# Patient Record
Sex: Female | Born: 1990 | Race: White | Hispanic: No | Marital: Single | Smoking: Never smoker
Health system: Southern US, Community
[De-identification: ages and names within clinical notes are randomized; demographics above are authoritative.]

## PROBLEM LIST (undated history)

## (undated) HISTORY — PX: ADENOIDECTOMY: SUR15

---

## 2013-11-02 ENCOUNTER — Inpatient Hospital Stay (HOSPITAL_COMMUNITY): Admission: AD | Admit: 2013-11-02 | Payer: Self-pay | Source: Ambulatory Visit | Admitting: Obstetrics and Gynecology

## 2019-05-10 ENCOUNTER — Other Ambulatory Visit: Payer: Self-pay

## 2019-05-10 ENCOUNTER — Encounter (HOSPITAL_COMMUNITY): Payer: Self-pay | Admitting: Obstetrics & Gynecology

## 2019-05-10 ENCOUNTER — Inpatient Hospital Stay (HOSPITAL_COMMUNITY): Payer: PRIVATE HEALTH INSURANCE

## 2019-05-10 ENCOUNTER — Observation Stay (HOSPITAL_COMMUNITY)
Admission: AD | Admit: 2019-05-10 | Discharge: 2019-05-11 | Disposition: A | Discharge status: Home / Self Care | Payer: PRIVATE HEALTH INSURANCE | Attending: Obstetrics and Gynecology | Admitting: Obstetrics and Gynecology

## 2019-05-10 DIAGNOSIS — D62 Acute posthemorrhagic anemia: Secondary | ICD-10-CM

## 2019-05-10 DIAGNOSIS — O039 Complete or unspecified spontaneous abortion without complication: Secondary | ICD-10-CM | POA: Diagnosis not present

## 2019-05-10 DIAGNOSIS — Z20822 Contact with and (suspected) exposure to covid-19: Secondary | ICD-10-CM | POA: Insufficient documentation

## 2019-05-10 DIAGNOSIS — O4690 Antepartum hemorrhage, unspecified, unspecified trimester: Secondary | ICD-10-CM | POA: Diagnosis not present

## 2019-05-10 DIAGNOSIS — D649 Anemia, unspecified: Secondary | ICD-10-CM | POA: Diagnosis present

## 2019-05-10 LAB — CBC
HCT: 41.3 % (ref 36.0–46.0)
HCT: 36.7 % (ref 36.0–46.0)
Hemoglobin: 14 g/dL (ref 12.0–15.0)
Hemoglobin: 12.4 g/dL (ref 12.0–15.0)
MCH: 32.4 pg (ref 26.0–34.0)
MCH: 32.5 pg (ref 26.0–34.0)
MCHC: 33.9 g/dL (ref 30.0–36.0)
MCHC: 33.8 g/dL (ref 30.0–36.0)
MCV: 95.6 fL (ref 80.0–100.0)
MCV: 96.1 fL (ref 80.0–100.0)
Platelets: 150 10*3/uL (ref 150–400)
Platelets: 225 10*3/uL (ref 150–400)
RBC: 4.32 MIL/uL (ref 3.87–5.11)
RBC: 3.82 MIL/uL — ABNORMAL LOW (ref 3.87–5.11)
RDW: 11.9 % (ref 11.5–15.5)
RDW: 12.1 % (ref 11.5–15.5)
WBC: 12.8 10*3/uL — ABNORMAL HIGH (ref 4.0–10.5)
WBC: 23.4 10*3/uL — ABNORMAL HIGH (ref 4.0–10.5)
nRBC: 0 % (ref 0.0–0.2)
nRBC: 0 % (ref 0.0–0.2)

## 2019-05-10 LAB — ABO/RH: ABO/RH(D): A NEG

## 2019-05-10 LAB — HCG, QUANTITATIVE, PREGNANCY: hCG, Beta Chain, Quant, S: 16681 m[IU]/mL — ABNORMAL HIGH (ref <5)

## 2019-05-10 MED ORDER — LACTATED RINGERS IV BOLUS
1000.0000 mL | Freq: Once | INTRAVENOUS | Status: AC
  Administered 2019-05-10: 1000 mL via INTRAVENOUS

## 2019-05-10 MED ORDER — RHO D IMMUNE GLOBULIN 1500 UNIT/2ML IJ SOSY
300.0000 ug | PREFILLED_SYRINGE | Freq: Once | INTRAMUSCULAR | Status: AC
  Administered 2019-05-10: 300 ug via INTRAMUSCULAR
  Filled 2019-05-10: qty 2

## 2019-05-10 MED ORDER — MISOPROSTOL 200 MCG PO TABS
800.0000 ug | ORAL_TABLET | Freq: Once | ORAL | Status: AC
  Administered 2019-05-10: 800 ug via BUCCAL
  Filled 2019-05-10: qty 4

## 2019-05-10 NOTE — MAU Note (Signed)
Pt states she has been bleeding for the past few days, was thinking she was miscarrying and started cramping around 1630 rating it a 7/10. Around 1745 started feeling a lot of pressure, got in shower and started "losing a lot of blood". Passed one large clot and has had a lot of bleeding since.

## 2019-05-10 NOTE — Progress Notes (Signed)
Pt was evaluated and treated by another provider and discharged. Prior to d/c pt had episode of syncope when she got up to BR. Pt found to be pale with HR in 50s. Pt A&O with verbal stimulus. Pt assisted to bed and BP 77/36. Stat IVF and labs ordered. Bleeding minimal.  2351: Small trickle from vagina. Pt feeling better. VSS. Hgb stable at 12.4 (started at 14.0) Speculum exam: large amt of blood and clots removed. POC almost completely through os, removed with ring forceps. Bleeding slowed. Cytotec ordered.  0215: Bleeding minimal. Ambulated to BR with assist and became lightheaded and nauseated on the way back. BP 80/41. No syncope. Discussed with Dr. Debroah Loop, will recheck CBC and admit for Obs.

## 2019-05-10 NOTE — Progress Notes (Signed)
Finished reviewing discharge instructions and obtaining signature. Pt stated she needed to try to go to the restroom before she left. Patient ambulated to the bathroom and sat on the toilet. Stated she felt like she was going to pass out. She then passed out while on the toilet, RN supported the patient and assisted her to the floor safely. Emergency cord was pulled in order to obtain more help, an additional RN came and provider was called in to assess the patient. Patient appeared pale and diaphoretic. She then spontaneously regained consciousness and was able to verbalize where she was and responded to commands while in the lying position. Two RNs and provider assisted the patient to the wheelchair in order to get her back to the bed. Pt stated she was nauseated and began to vomit. Once stable the patient was moved back to the bed where BP cuff and pulse ox were applied and the order was given by the provider to apply oxygen via a nasal cannula and start an IV/draw a few more labs. Patient verbalized feeling a little tingly but better lying down in bed. Pt color started to return in her cheeks and lips. Pt smiling and carrying out conversation.

## 2019-05-10 NOTE — MAU Provider Note (Addendum)
History     CSN: [id number]  Arrival date and time: 05/10/19 8101   First Provider Initiated Contact with Patient 05/10/19 2016       Chief Complaint  Patient presents with  . Vaginal Bleeding   Vickie Estes is a 29 y.o. G3P2 at [redacted]w[redacted]d by LMP who presents to MAU with complaints of possible miscarriage. Patient reports vaginal spotting started on Wednesday, described as light pink when she wiped. Bleeding continued to increase over the weekend then significantly increased today. Patient reports dark red vaginal bleeding that soaked through underwear and pants, patient reports getting lightheaded and dizzy upon arrival to MAU. Patient reports abdominal pain which she describes as lower abdominal cramping that started occurring once the vaginal bleeding increased. Rates pain 6/10- declines pain medication at this time. Patient reports having a ultrasound 3 weeks ago at [facility] and patient reports an IUP but unable to hear heart tones at that time.    OB History    Gravida  1   Para      Term      Preterm      AB      Living        SAB      TAB      Ectopic      Multiple      Live Births              No past medical history on file.  Past Surgical History:  Procedure Laterality Date  . ADENOIDECTOMY      No family history on file.  Social History   Tobacco Use  . Smoking status: Not on file  Substance Use Topics  . Alcohol use: Not on file  . Drug use: Not on file    Allergies: Not on File  No medications prior to admission.    Review of Systems  Constitutional: Negative.   Respiratory: Negative.   Cardiovascular: Negative.   Gastrointestinal: Positive for abdominal pain. Negative for constipation, diarrhea, nausea and vomiting.  Genitourinary: Positive for vaginal bleeding. Negative for difficulty urinating, dysuria, frequency, pelvic pain and urgency.  Neurological: Positive for dizziness and light-headedness.   Physical Exam    Blood pressure (!) 111/59, pulse 73, temperature 98 F (36.7 C), temperature source Oral, resp. rate 16, SpO2 100 %.  Physical Exam  Nursing note and vitals reviewed. Constitutional: She is oriented to person, place, and time. She appears well-developed and well-nourished. She appears distressed.  Patient tearful in room, sitting on toilet upon arrival to room  Cardiovascular: Normal rate and regular rhythm.  Respiratory: Effort normal and breath sounds normal.  GI: Soft. She exhibits no distension. There is abdominal tenderness. There is no rebound and no guarding.  Genitourinary:    Vaginal bleeding present.  There is bleeding in the vagina.    Genitourinary Comments: Pelvic exam: Cervix pink, visually open with products of contraception protruding from cervix, without lesion, copious amounts of dark red vaginal bleeding with large clots passed (11 faux swabs), vaginal walls and external genitalia normal   Musculoskeletal:        General: No edema. Normal range of motion.  Neurological: She is alert and oriented to person, place, and time.  Psychiatric: She has a normal mood and affect. Her behavior is normal. Thought content normal.   MAU Course  Procedures  MDM Orders Placed This Encounter  Procedures  . US OB LESS THAN 14 WEEKS WITH OB TRANSVAGINAL  . Urinalysis, Routine  w reflex microscopic  . CBC  . hCG, quantitative, pregnancy  . ABO/Rh   Patient denies lightheadedness and dizzy once patient moved off toilet to bed.  Workup for SAB initiated  Patient does not want POC manual removed, wants to occur naturally   Hgb 14  ABO/Rh negative - Rhogam given to patient prior to discharge Labs and [place] report reviewed:  US OB Comp Less 14 Wks  Result Date: 05/10/2019 CLINICAL DATA:  Heavy vaginal bleeding EXAM: OBSTETRIC <14 WK ULTRASOUND TECHNIQUE: Transabdominal ultrasound was performed for evaluation of the gestation as well as the maternal uterus and adnexal regions.  COMPARISON:  None. FINDINGS: Intrauterine gestational sac: None Maternal uterus/adnexae: Endometrium is thickened and slightly heterogeneous measuring 16 mm. The uterus is anteverted. No uterine masses. Heterogeneous material within the cervical canal consistent with blood products. Left ovary measures 3.0 x 1.5 x 2.1 cm in the right ovary measures 2.8 x 1.8 x 1.7 cm. No pelvic free fluid. IMPRESSION: 1. No evidence of intrauterine pregnancy at this time. Heterogeneous endometrial thickening and likely blood products within the cervix may suggest spontaneous abortion. Serial beta HCG measurements and follow-up ultrasound may be needed. 2. Otherwise unremarkable exam. Electronically Signed   By: Sharlet Salina M.D.   On: 05/10/2019 21:48   Discussed with patient ultrasound results. Patient is tearful but coping appropriately.   Discussed with patient options of expectant management or cytotec to rid of rest of POCs. Patient decided on expectant management, does not want cytotec or pain medication at this time unless it is needed.   Patient denies being light headed or dizzy. Patient color is normal and not pale. Vaginal bleeding assess and minimal on pad. Patient reports abdominal pain is decreasing.   Educated and discussed follow up care in the office. Lab scheduled for this week d/t patient needing Friday and follow up with provider scheduled for 2 weeks. Educated and discussed with patient reasons to present to MAU for evaluation, discussed any occurrence of increased vaginal bleeding/passing clots, increased abdominal pain and/or lightheaded, dizziness present to MAU. Patient verbalizes understanding and is currently stable at time of discharge.   Assessment and Plan   1. SAB (spontaneous abortion)   2. Vaginal bleeding during pregnancy    Discharge home Follow up as scheduled in the office  Return to MAU as needed for reasons discussed and/or emergencies   Allergies as of 05/11/2019   No  Known Allergies     Medication List    TAKE these medications   prenatal multivitamin Tabs tablet Take 1 tablet by mouth daily at 12 noon.       Sharyon Cable CNM 05/10/2019, 10:10 PM

## 2019-05-11 ENCOUNTER — Encounter (HOSPITAL_COMMUNITY): Payer: Self-pay | Admitting: Obstetrics & Gynecology

## 2019-05-11 DIAGNOSIS — D649 Anemia, unspecified: Secondary | ICD-10-CM | POA: Diagnosis present

## 2019-05-11 LAB — CBC WITH DIFFERENTIAL/PLATELET
Abs Immature Granulocytes: 0.03 10*3/uL (ref 0.00–0.07)
Basophils Absolute: 0 10*3/uL (ref 0.0–0.1)
Basophils Relative: 0 %
Eosinophils Absolute: 0 10*3/uL (ref 0.0–0.5)
Eosinophils Relative: 0 %
HCT: 32.3 % — ABNORMAL LOW (ref 36.0–46.0)
Hemoglobin: 11.1 g/dL — ABNORMAL LOW (ref 12.0–15.0)
Immature Granulocytes: 0 %
Lymphocytes Relative: 20 %
Lymphs Abs: 1.5 10*3/uL (ref 0.7–4.0)
MCH: 32 pg (ref 26.0–34.0)
MCHC: 34.4 g/dL (ref 30.0–36.0)
MCV: 93.1 fL (ref 80.0–100.0)
Monocytes Absolute: 0.5 10*3/uL (ref 0.1–1.0)
Monocytes Relative: 7 %
Neutro Abs: 5.3 10*3/uL (ref 1.7–7.7)
Neutrophils Relative %: 73 %
Platelets: 121 10*3/uL — ABNORMAL LOW (ref 150–400)
RBC: 3.47 MIL/uL — ABNORMAL LOW (ref 3.87–5.11)
RDW: 13.3 % (ref 11.5–15.5)
WBC: 7.4 10*3/uL (ref 4.0–10.5)
nRBC: 0 % (ref 0.0–0.2)

## 2019-05-11 LAB — CBC
HCT: 29.6 % — ABNORMAL LOW (ref 36.0–46.0)
HCT: 25.6 % — ABNORMAL LOW (ref 36.0–46.0)
Hemoglobin: 10 g/dL — ABNORMAL LOW (ref 12.0–15.0)
Hemoglobin: 8.6 g/dL — ABNORMAL LOW (ref 12.0–15.0)
MCH: 32.5 pg (ref 26.0–34.0)
MCH: 32.5 pg (ref 26.0–34.0)
MCHC: 33.8 g/dL (ref 30.0–36.0)
MCHC: 33.6 g/dL (ref 30.0–36.0)
MCV: 96.1 fL (ref 80.0–100.0)
MCV: 96.6 fL (ref 80.0–100.0)
Platelets: 139 10*3/uL — ABNORMAL LOW (ref 150–400)
Platelets: 123 10*3/uL — ABNORMAL LOW (ref 150–400)
RBC: 3.08 MIL/uL — ABNORMAL LOW (ref 3.87–5.11)
RBC: 2.65 MIL/uL — ABNORMAL LOW (ref 3.87–5.11)
RDW: 12.1 % (ref 11.5–15.5)
RDW: 12.2 % (ref 11.5–15.5)
WBC: 11.2 10*3/uL — ABNORMAL HIGH (ref 4.0–10.5)
WBC: 7.3 10*3/uL (ref 4.0–10.5)
nRBC: 0 % (ref 0.0–0.2)
nRBC: 0 % (ref 0.0–0.2)

## 2019-05-11 LAB — RH IG WORKUP (INCLUDES ABO/RH)
ABO/RH(D): A NEG
Antibody Screen: NEGATIVE
Gestational Age(Wks): 11
Unit division: 0

## 2019-05-11 LAB — PREPARE RBC (CROSSMATCH)

## 2019-05-11 LAB — SARS CORONAVIRUS 2 (TAT 6-24 HRS): SARS Coronavirus 2: NEGATIVE

## 2019-05-11 MED ORDER — AMMONIA AROMATIC IN INHA
RESPIRATORY_TRACT | Status: AC
  Administered 2019-05-11: 3 mL
  Filled 2019-05-11: qty 10

## 2019-05-11 MED ORDER — ACETAMINOPHEN 325 MG PO TABS
650.0000 mg | ORAL_TABLET | ORAL | Status: DC | PRN
  Administered 2019-05-11: 650 mg via ORAL
  Filled 2019-05-11: qty 2

## 2019-05-11 MED ORDER — LACTATED RINGERS IV BOLUS
1000.0000 mL | Freq: Once | INTRAVENOUS | Status: AC
  Administered 2019-05-11: 1000 mL via INTRAVENOUS

## 2019-05-11 MED ORDER — LACTATED RINGERS IV SOLN: INTRAVENOUS | Status: DC

## 2019-05-11 MED ORDER — ACETAMINOPHEN 325 MG PO TABS
650.0000 mg | ORAL_TABLET | Freq: Once | ORAL | Status: AC
  Administered 2019-05-11: 650 mg via ORAL
  Filled 2019-05-11: qty 2

## 2019-05-11 MED ORDER — SODIUM CHLORIDE 0.9% IV SOLUTION: Freq: Once | INTRAVENOUS | Status: DC

## 2019-05-11 MED ORDER — FERROUS SULFATE 325 (65 FE) MG PO TABS
325.0000 mg | ORAL_TABLET | Freq: Three times a day (TID) | ORAL | Status: DC
  Administered 2019-05-11: 325 mg via ORAL
  Filled 2019-05-11 (×2): qty 1

## 2019-05-11 MED ORDER — AMMONIA AROMATIC IN INHA
RESPIRATORY_TRACT | Status: AC
  Filled 2019-05-11: qty 50

## 2019-05-11 MED ORDER — DIPHENHYDRAMINE HCL 50 MG/ML IJ SOLN
25.0000 mg | Freq: Once | INTRAMUSCULAR | Status: AC
  Administered 2019-05-11: 25 mg via INTRAVENOUS
  Filled 2019-05-11: qty 1

## 2019-05-11 MED ORDER — FERROUS SULFATE 325 (65 FE) MG PO TABS: 325.0000 mg | ORAL_TABLET | Freq: Two times a day (BID) | ORAL | 3 refills | Status: AC

## 2019-05-11 NOTE — Discharge Instructions (Signed)
Managing Pregnancy Loss Pregnancy loss can happen any time during a pregnancy. Often the cause is not known. It is rarely because of anything you did. Pregnancy loss in early pregnancy (during the first trimester) is called a miscarriage. This type of pregnancy loss is the most common. Pregnancy loss that happens after 20 weeks of pregnancy is called fetal demise if the baby's heart stops beating before birth. Fetal demise is much less common. Some women experience spontaneous labor shortly after fetal demise resulting in a stillborn birth (stillbirth). Any pregnancy loss can be devastating. You will need to recover both physically and emotionally. Most women are able to get pregnant again after a pregnancy loss and deliver a healthy baby. How to manage emotional recovery  Pregnancy loss is very hard emotionally. You may feel many different emotions while you grieve. You may feel sad and angry. You may also feel guilty. It is normal to have periods of crying. Emotional recovery can take longer than physical recovery. It is different for everyone. Taking these steps can help you in managing this loss:  Remember that it is unlikely you did anything to cause the pregnancy loss.  Share your thoughts and feelings with friends, family, and your partner. Remember that your partner is also recovering emotionally.  Make sure you have a good support system. Do not spend too much time alone.  Meet with a pregnancy loss counselor or join a pregnancy loss support group.  Get enough sleep and eat a healthy diet. Return to regular exercise when you have recovered physically.  Do not use drugs or alcohol to manage your emotions.  Consider seeing a mental health professional to help you recover emotionally.  Ask a friend or loved one to help you decide what to do with any clothing and nursery items you received for your baby. In the case of a stillbirth, many women benefit from taking additional steps in the  grieving process. You may want to:  Hold your baby after the birth.  Name your baby.  Request a birth certificate.  Create a keepsake such as handprints or footprints.  Dress your baby and have a picture taken.  Make funeral arrangements.  Ask for a baptism or blessing. Hospitals have staff members who can help you with all these arrangements. How to recognize emotional stress It is normal to have emotional stress after a pregnancy loss. But emotional stress that lasts a long time or becomes severe requires treatment. Watch out for these signs of severe emotional stress:  Sadness, anger, or guilt that is not going away and is interfering with your normal activities.  Relationship problems that have occurred or gotten worse since the pregnancy loss.  Signs of depression that last longer than 2 weeks. These may include: ? Sadness. ? Anxiety. ? Hopelessness. ? Loss of interest in activities you enjoy. ? Inability to concentrate. ? Trouble sleeping or sleeping too much. ? Loss of appetite or overeating. ? Thoughts of death or of hurting yourself. Follow these instructions at home:  Take over-the-counter and prescription medicines only as told by your health care provider.  Rest at home until your energy level returns. Return to your normal activities as told by your health care provider. Ask your health care provider what activities are safe for you.  When you are ready, meet with your health care provider to discuss steps to take for a future pregnancy.  Keep all follow-up visits as told by your health care provider. This is important.   Where to find support  To help you and your partner with the process of grieving, talk with your health care provider or seek counseling.  Consider meeting with others who have experienced pregnancy loss. Ask your health care provider about support groups and resources. Where to find more information  [place].S[place] Department of Health and Human  Services Office on Women's Health: www.womenshealth.gov  American Pregnancy Association: www.americanpregnancy.org Contact a health care provider if:  You continue to experience grief, sadness, or lack of motivation for everyday activities, and those feelings do not improve over time.  You are struggling to recover emotionally, especially if you are using alcohol or substances to help. Get help right away if:  You have thoughts of hurting yourself or others. If you ever feel like you may hurt yourself or others, or have thoughts about taking your own life, get help right away. You can go to your nearest emergency department or call:  Your local emergency services (911 in the [place]  A suicide crisis helpline, such as the National Suicide Prevention Lifeline at 1-800-273-8255. This is open 24 hours a day. Summary  Any pregnancy loss can be difficult physically and emotionally.  You may experience many different emotions while you grieve. Emotional recovery can last longer than physical recovery.  It is normal to have emotional stress after a pregnancy loss. But emotional stress that lasts a long time or becomes severe requires treatment.  See your health care provider if you are struggling emotionally after a pregnancy loss. This information is not intended to replace advice given to you by your health care provider. Make sure you discuss any questions you have with your health care provider. Document Revised: 05/27/2018 Document Reviewed: 04/17/2017 Elsevier Patient Education  2020 Elsevier Inc. Miscarriage A miscarriage is the loss of an unborn baby (fetus) before the 20th week of pregnancy. Follow these instructions at home: Medicines   Take over-the-counter and prescription medicines only as told by your doctor.  If you were prescribed antibiotic medicine, take it as told by your doctor. Do not stop taking the antibiotic even if you start to feel better.  Do not take NSAIDs  unless your doctor says that this is safe for you. NSAIDs include aspirin and ibuprofen. These medicines can cause bleeding. Activity  Rest as directed. Ask your doctor what activities are safe for you.  Have someone help you at home during this time. General instructions  Write down how many pads you use each day and how soaked they are.  Watch the amount of tissue or clumps of blood (blood clots) that you pass from your vagina. Save any large amounts of tissue for your doctor.  Do not use tampons, douche, or have sex until your doctor approves.  To help you and your partner with the process of grieving, talk with your doctor or seek counseling.  When you are ready, meet with your doctor to talk about steps you should take for your health. Also, talk with your doctor about steps to take to have a healthy pregnancy in the future.  Keep all follow-up visits as told by your doctor. This is important. Contact a doctor if:  You have a fever or chills.  You have vaginal discharge that smells bad.  You have more bleeding. Get help right away if:  You have very bad cramps or pain in your back or belly.  You pass clumps of blood that are walnut-sized or larger from your vagina.  You pass tissue   that is walnut-sized or larger from your vagina.  You soak more than 1 regular pad in an hour.  You get light-headed or weak.  You faint (pass out).  You have feelings of sadness that do not go away, or you have thoughts of hurting yourself. Summary  A miscarriage is the loss of an unborn baby before the 20th week of pregnancy.  Follow your doctor's instructions for home care. Keep all follow-up appointments.  To help you and your partner with the process of grieving, talk with your doctor or seek counseling. This information is not intended to replace advice given to you by your health care provider. Make sure you discuss any questions you have with your health care provider. Document  Revised: 05/29/2018 Document Reviewed: 03/12/2016 Elsevier Patient Education  2020 Elsevier Inc.  

## 2019-05-11 NOTE — Plan of Care (Signed)
Pt to be discharged home with printed instructions. No concerns noted. Desmond Szabo L Markevius Trombetta, RN  

## 2019-05-11 NOTE — MAU Note (Signed)
Bleeding minimal now. Small amount on pad. Pt sitting up at 90 degrees for 15 min. Stated she felt fine. VSS(see flow sheet). Had pat dangle legs over side of bed for 2-3 min before attempting to go to BR. Pt stated she was ready. Assisted pt to toilet. Voided without comp,ication. passed a moderate sized clot. assisted back to bed. Pt state as she was about to sit in bed she felt a little dizzy and nauseated. Had her lay down in bed and took b/p. B/P dropped to 86/40.  Had pt laied down  And b/p came back up to 95/55.Pt feeling better. Notified M.Bhambri, CNM of incident.  Pt to be admited for observation. STAT CBC ordered.

## 2019-05-11 NOTE — Discharge Summary (Addendum)
Obstetric Discharge Summary Vickie Estes is a 29 y.o. female G3P2002 s/p SAB with symptomatic anemia. Pt seen in MAU with VB and SAB was confirmed. Had syncope episode prior to discharge home. Pt was reexamined and POCs were removed from os, bleeding slowed, and was given Cytotec. Hgb dropped from 14 to 12.4. EBL was 650 mL but bled into toilet and shower at home. Despite IVF pt continued to be symptomatic and hypotensive. She received 2 units pRBC. She ambulated without SOB, chest pain, lightheadedness/dizziness. Patient found stable for discharge Reason for Admission: Vaginal bleeding with SAb, anemai Prenatal Procedures: ultrasound Intrapartum Procedures: none, SAB 11 weeks Postpartum Procedures: none Complications-Operative and Postpartum: none Hemoglobin  Date Value Ref Range Status  05/11/2019 8.6 (L) 12.0 - 15.0 g/dL Final   HCT  Date Value Ref Range Status  05/11/2019 25.6 (L) 36.0 - 46.0 % Final    Physical Exam:  General: alert, cooperative and no distress Lochia: appropriate Uterine Fundus: below pubis DVT Evaluation: No evidence of DVT seen on physical exam.  Discharge Diagnoses: Spontaneous abortion  Discharge Information: Date: 05/11/2019 Activity: pelvic rest Diet: routine Medications: None Condition: stable Instructions: refer to practice specific booklet Discharge to: home Follow-up Information    [facility]. Go on 05/21/2019.   Specialty: Obstetrics and Gynecology Why: Follow up as scheduled in the office with [facility] information: [address] [place] [address] [phone]           Vickie Estes 05/11/2019, 4:06 PM

## 2019-05-11 NOTE — MAU Note (Signed)
M.Bhambri,CNM at bedside for pelvic exam to assess further vag bleeding.  Removed several large clots and POC from vaginal vault.  Bleeding  slowed down. Pt tolerated well. V/ss.(see flow sheet)

## 2019-05-11 NOTE — Progress Notes (Signed)
Called to patient's room. Pt found to be passed out with husband holding her. Smelling salts provided. Patient pale. Fluids open wide. BP 94/45. Dr. Debroah Loop at bedside. Carmelina Dane, RN

## 2019-05-11 NOTE — H&P (Signed)
OBSTETRIC ADMISSION HISTORY AND PHYSICAL  Scotland Dost is a 29 y.o. female G3P2002 s/p SAB with symptomatic anemia. Pt seen in MAU with VB and SAB was confirmed. Had syncope episode prior to discharge home. Pt was reexamined and POCs were removed from os, bleeding slowed, and was given Cytotec. Hgb dropped from 14 to 12.4. EBL was 650 mL but bled into toilet and shower at home. Despite IVF pt continued to be symptomatic and hypotensive.  Prenatal History/Complications: - none  Past Medical History: History reviewed. No pertinent past medical history.  Past Surgical History: Past Surgical History:  Procedure Laterality Date  . ADENOIDECTOMY      Obstetrical History: OB History    Gravida  3   Para  2   Term  2   Preterm      AB      Living  2     SAB      TAB      Ectopic      Multiple      Live Births  2           Social History: Social History   Socioeconomic History  . Marital status: Single    Spouse name: Not on file  . Number of children: Not on file  . Years of education: Not on file  . Highest education level: Not on file  Occupational History  . Not on file  Tobacco Use  . Smoking status: Never Smoker  . Smokeless tobacco: Never Used  Substance and Sexual Activity  . Alcohol use: Not Currently  . Drug use: Never  . Sexual activity: Yes    Birth control/protection: None  Other Topics Concern  . Not on file  Social History Narrative  . Not on file   Social Determinants of Health   Financial Resource Strain:   . Difficulty of Paying Living Expenses:   Food Insecurity:   . Worried About Charity fundraiser in the Last Year:   . Arboriculturist in the Last Year:   Transportation Needs:   . Film/video editor (Medical):   Marland Kitchen Lack of Transportation (Non-Medical):   Physical Activity:   . Days of Exercise per Week:   . Minutes of Exercise per Session:   Stress:   . Feeling of Stress :   Social Connections:   . Frequency of  Communication with Friends and Family:   . Frequency of Social Gatherings with Friends and Family:   . Attends Religious Services:   . Active Member of Clubs or Organizations:   . Attends Archivist Meetings:   Marland Kitchen Marital Status:     Family History: Family History  Problem Relation Age of Onset  . Hypertension Mother     Allergies: No Known Allergies  Medications Prior to Admission  Medication Sig Dispense Refill Last Dose  . Prenatal Vit-Fe Fumarate-FA (PRENATAL MULTIVITAMIN) TABS tablet Take 1 tablet by mouth daily at 12 noon.        Review of Systems:  All systems reviewed and negative except as stated in HPI  PE: Blood pressure (!) 95/55, pulse 72, temperature 98 F (36.7 C), temperature source Oral, resp. rate 17, SpO2 100 %. General appearance: alert, cooperative and no distress Lungs: regular rate and effort Heart: regular rate  Abdomen: soft, non-tender GU: bleeding minimal    Prenatal labs: ABO, Rh: --/--/A NEG (03/22 2240) Antibody: NEG (03/22 2240)  Results for orders placed or performed during the hospital  encounter of 05/10/19 (from the past 24 hour(s))  CBC   Collection Time: 05/10/19  8:37 PM  Result Value Ref Range   WBC 12.8 (H) 4.0 - 10.5 K/uL   RBC 4.32 3.87 - 5.11 MIL/uL   Hemoglobin 14.0 12.0 - 15.0 g/dL   HCT 02.5 85.2 - 77.8 %   MCV 95.6 80.0 - 100.0 fL   MCH 32.4 26.0 - 34.0 pg   MCHC 33.9 30.0 - 36.0 g/dL   RDW 24.2 35.3 - 61.4 %   Platelets 150 150 - 400 K/uL   nRBC 0.0 0.0 - 0.2 %  hCG, quantitative, pregnancy   Collection Time: 05/10/19  8:37 PM  Result Value Ref Range   hCG, Beta Chain, Quant, S 16,681 (H) <5 mIU/mL  ABO/Rh   Collection Time: 05/10/19  8:37 PM  Result Value Ref Range   ABO/RH(D) A NEG    Antibody Screen NOT NEEDED   Rh IG workup (includes ABO/Rh)   Collection Time: 05/10/19  8:37 PM  Result Value Ref Range   Gestational Age(Wks) 11    ABO/RH(D) A NEG    Antibody Screen NEG    Unit Number  [id number]    Blood Component Type RHIG    Unit division 00    Status of Unit ISSUED    Transfusion Status      OK TO TRANSFUSE Performed at [facility], [address] [place], [place] [address]   CBC   Collection Time: 05/10/19 10:37 PM  Result Value Ref Range   WBC 23.4 (H) 4.0 - 10.5 K/uL   RBC 3.82 (L) 3.87 - 5.11 MIL/uL   Hemoglobin 12.4 12.0 - 15.0 g/dL   HCT 67.1 24.5 - 80.9 %   MCV 96.1 80.0 - 100.0 fL   MCH 32.5 26.0 - 34.0 pg   MCHC 33.8 30.0 - 36.0 g/dL   RDW 98.3 38.2 - 50.5 %   Platelets 225 150 - 400 K/uL   nRBC 0.0 0.0 - 0.2 %  Type and screen   Collection Time: 05/10/19 10:40 PM  Result Value Ref Range   ABO/RH(D) A NEG    Antibody Screen NEG    Sample Expiration      05/13/2019,2359 Performed at [facility], [address] [place], [place] [address]     Patient Active Problem List   Diagnosis Date Noted  . Symptomatic anemia 05/11/2019   Assessment: Arleny Kruger is a 29 y.o. G3P2002 s/p SAB with symptomatic anemia   Plan: Admit to [facility] unit T&S CBC now Mngt per Dr. Ripley Fraise, CNM  05/11/2019, 2:31 AM

## 2019-05-12 LAB — TYPE AND SCREEN
ABO/RH(D): A NEG
Antibody Screen: NEGATIVE
Unit division: 0
Unit division: 0

## 2019-05-12 LAB — SURGICAL PATHOLOGY

## 2019-05-12 LAB — BPAM RBC
Blood Product Expiration Date: 202104032359
Blood Product Expiration Date: 202104052359
ISSUE DATE / TIME: 202103230903
ISSUE DATE / TIME: 202103231304
Unit Type and Rh: 600
Unit Type and Rh: 600

## 2019-05-14 ENCOUNTER — Other Ambulatory Visit: Payer: Self-pay

## 2019-05-14 ENCOUNTER — Other Ambulatory Visit: Payer: PRIVATE HEALTH INSURANCE

## 2019-05-14 DIAGNOSIS — O039 Complete or unspecified spontaneous abortion without complication: Secondary | ICD-10-CM

## 2019-05-15 LAB — BETA HCG QUANT (REF LAB): hCG Quant: 1371 m[IU]/mL

## 2019-05-26 ENCOUNTER — Encounter: Payer: Self-pay | Admitting: Certified Nurse Midwife

## 2019-05-26 ENCOUNTER — Other Ambulatory Visit: Payer: Self-pay

## 2019-05-26 ENCOUNTER — Ambulatory Visit (INDEPENDENT_AMBULATORY_CARE_PROVIDER_SITE_OTHER): Payer: PRIVATE HEALTH INSURANCE | Admitting: Certified Nurse Midwife

## 2019-05-26 VITALS — BP 102/66 | HR 67 | Ht 66.0 in | Wt 129.8 lb

## 2019-05-26 DIAGNOSIS — O039 Complete or unspecified spontaneous abortion without complication: Secondary | ICD-10-CM | POA: Diagnosis not present

## 2019-05-26 DIAGNOSIS — D500 Iron deficiency anemia secondary to blood loss (chronic): Secondary | ICD-10-CM

## 2019-05-26 NOTE — Progress Notes (Signed)
Pt is in the office for follow up SAB, pt reports very light spotting and tissue, denies pain today, but has had some random cramping.

## 2019-05-26 NOTE — Progress Notes (Signed)
History:  [name] is a 29 y.o. [id number] who presents to clinic today for SAB follow up   Patient was discharged from [facility] on  3/23 after presenting to [facility]U on 3/22 for vaginal bleeding, she was diagnosed with miscarriage at that time. D/t blood loss and symptomatic anemia patient was kept overnight for blood transfusion.   Patient reports that vaginal bleeding has continued to decrease and is not dark reddish brown mucous discharge. She denies abdominal pain.   The following portions of the patient's history were reviewed and updated as appropriate: allergies, current medications, family history, past medical history, social history, past surgical history and problem list.  Review of Systems:  Review of Systems  Constitutional: Negative.   Respiratory: Negative.   Cardiovascular: Negative.   Gastrointestinal: Negative.   Genitourinary:       Vaginal bleeding/discharge  Musculoskeletal: Negative.       Objective:  Physical Exam BP 102/66   Pulse 67   Ht 5\' 6"  (1.676 m)   Wt 129 lb 12.8 oz (58.9 kg)   Breastfeeding Unknown   BMI 20.95 kg/m  Physical Exam Vitals reviewed.  Cardiovascular:     Rate and Rhythm: Normal rate and regular rhythm.  Pulmonary:     Effort: Pulmonary effort is normal.     Breath sounds: Normal breath sounds.  Abdominal:     General: There is no distension.     Palpations: Abdomen is soft.     Tenderness: There is no abdominal tenderness. There is no guarding.  Skin:    General: Skin is warm and dry.  Neurological:     Mental Status: She is alert and oriented to person, place, and time.  Psychiatric:        Mood and Affect: Mood normal.        Behavior: Behavior normal.        Thought Content: Thought content normal.     Assessment & Plan:  1. Spontaneous miscarriage - Patient doing well, grieving appropriately  - Patient does want to conceive again this year, discussed letting body heal and waiting at least 3 months prior to trying  to conceive  - Discussed with patient that bleeding most likely will stop this week based on description - Beta hCG quant (ref lab)  2. Iron deficiency anemia due to chronic blood loss - follow up CBC obtained following transfusion  - patient denies lightheaded or dizziness since discharge from hospital  - CBC   , CNM 05/26/2019 7:12 PM

## 2019-05-27 LAB — BETA HCG QUANT (REF LAB): hCG Quant: 102 m[IU]/mL

## 2019-05-27 LAB — CBC
Hematocrit: 38.8 % (ref 34.0–46.6)
Hemoglobin: 13.4 g/dL (ref 11.1–15.9)
MCH: 33 pg (ref 26.6–33.0)
MCHC: 34.5 g/dL (ref 31.5–35.7)
MCV: 96 fL (ref 79–97)
Platelets: 188 10*3/uL (ref 150–450)
RBC: 4.06 x10E6/uL (ref 3.77–5.28)
RDW: 12.9 % (ref 11.7–15.4)
WBC: 6.9 10*3/uL (ref 3.4–10.8)

## 2019-05-28 ENCOUNTER — Encounter: Payer: Self-pay | Admitting: Certified Nurse Midwife

## 2019-10-18 ENCOUNTER — Inpatient Hospital Stay (HOSPITAL_COMMUNITY): Payer: PRIVATE HEALTH INSURANCE

## 2019-10-18 ENCOUNTER — Inpatient Hospital Stay (HOSPITAL_COMMUNITY)
Admission: AD | Admit: 2019-10-18 | Discharge: 2019-10-18 | Disposition: A | Discharge status: Home / Self Care | Payer: PRIVATE HEALTH INSURANCE | Attending: Obstetrics & Gynecology | Admitting: Obstetrics & Gynecology

## 2019-10-18 ENCOUNTER — Encounter (HOSPITAL_COMMUNITY): Payer: Self-pay

## 2019-10-18 ENCOUNTER — Other Ambulatory Visit: Payer: Self-pay

## 2019-10-18 DIAGNOSIS — O4690 Antepartum hemorrhage, unspecified, unspecified trimester: Secondary | ICD-10-CM

## 2019-10-18 DIAGNOSIS — Z3A09 9 weeks gestation of pregnancy: Secondary | ICD-10-CM

## 2019-10-18 DIAGNOSIS — O360910 Maternal care for other rhesus isoimmunization, first trimester, not applicable or unspecified: Secondary | ICD-10-CM | POA: Diagnosis not present

## 2019-10-18 DIAGNOSIS — Z6791 Unspecified blood type, Rh negative: Secondary | ICD-10-CM

## 2019-10-18 DIAGNOSIS — O039 Complete or unspecified spontaneous abortion without complication: Secondary | ICD-10-CM | POA: Insufficient documentation

## 2019-10-18 LAB — COMPREHENSIVE METABOLIC PANEL
ALT: 12 U/L (ref 0–44)
AST: 19 U/L (ref 15–41)
Albumin: 4.2 g/dL (ref 3.5–5.0)
Alkaline Phosphatase: 37 U/L — ABNORMAL LOW (ref 38–126)
Anion gap: 11 (ref 5–15)
BUN: 9 mg/dL (ref 6–20)
CO2: 23 mmol/L (ref 22–32)
Calcium: 9.4 mg/dL (ref 8.9–10.3)
Chloride: 102 mmol/L (ref 98–111)
Creatinine, Ser: 0.71 mg/dL (ref 0.44–1.00)
GFR calc Af Amer: >60 mL/min (ref >60)
GFR calc non Af Amer: >60 mL/min (ref >60)
Glucose, Bld: 103 mg/dL — ABNORMAL HIGH (ref 70–99)
Potassium: 3.8 mmol/L (ref 3.5–5.1)
Sodium: 136 mmol/L (ref 135–145)
Total Bilirubin: 0.7 mg/dL (ref 0.3–1.2)
Total Protein: 7 g/dL (ref 6.5–8.1)

## 2019-10-18 LAB — CBC
HCT: 39.3 % (ref 36.0–46.0)
Hemoglobin: 13.2 g/dL (ref 12.0–15.0)
MCH: 31.7 pg (ref 26.0–34.0)
MCHC: 33.6 g/dL (ref 30.0–36.0)
MCV: 94.5 fL (ref 80.0–100.0)
Platelets: 149 10*3/uL — ABNORMAL LOW (ref 150–400)
RBC: 4.16 MIL/uL (ref 3.87–5.11)
RDW: 12.2 % (ref 11.5–15.5)
WBC: 8.1 10*3/uL (ref 4.0–10.5)
nRBC: 0 % (ref 0.0–0.2)

## 2019-10-18 LAB — URINALYSIS, ROUTINE W REFLEX MICROSCOPIC
Bacteria, UA: NONE SEEN
Bilirubin Urine: NEGATIVE
Glucose, UA: NEGATIVE mg/dL
Ketones, ur: NEGATIVE mg/dL
Leukocytes,Ua: NEGATIVE
Nitrite: NEGATIVE
Protein, ur: NEGATIVE mg/dL
RBC / HPF: >50 RBC/hpf — ABNORMAL HIGH (ref 0–5)
Specific Gravity, Urine: 1.018 (ref 1.005–1.030)
pH: 7 (ref 5.0–8.0)

## 2019-10-18 LAB — WET PREP, GENITAL
Clue Cells Wet Prep HPF POC: NONE SEEN
Sperm: NONE SEEN
Trich, Wet Prep: NONE SEEN
WBC, Wet Prep HPF POC: NONE SEEN
Yeast Wet Prep HPF POC: NONE SEEN

## 2019-10-18 LAB — POCT PREGNANCY, URINE: Preg Test, Ur: POSITIVE — AB

## 2019-10-18 LAB — HCG, QUANTITATIVE, PREGNANCY: hCG, Beta Chain, Quant, S: 35862 m[IU]/mL — ABNORMAL HIGH (ref <5)

## 2019-10-18 MED ORDER — ACETAMINOPHEN 500 MG PO TABS
1000.0000 mg | ORAL_TABLET | Freq: Once | ORAL | Status: AC
  Administered 2019-10-18: 1000 mg via ORAL
  Filled 2019-10-18: qty 2

## 2019-10-18 MED ORDER — MISOPROSTOL 200 MCG PO TABS: 200.0000 ug | ORAL_TABLET | Freq: Three times a day (TID) | ORAL | 0 refills | Status: AC

## 2019-10-18 MED ORDER — RHO D IMMUNE GLOBULIN 1500 UNIT/2ML IJ SOSY
300.0000 ug | PREFILLED_SYRINGE | Freq: Once | INTRAMUSCULAR | Status: AC
  Administered 2019-10-18: 300 ug via INTRAMUSCULAR
  Filled 2019-10-18: qty 2

## 2019-10-18 NOTE — MAU Note (Signed)
Vickie Estes is a 29 y.o. at Unknown here in [facility]U reporting: abdominal pain and VB, pt reports having a positive home pregnancy test LMP: 08/12/2019 Onset of complaint: pt states bleeding started a couple of days ago that has progressively gotten worse and has started to have stringy clots. Pain score:3    Lab orders placed from triage: urine preg test

## 2019-10-18 NOTE — Discharge Instructions (Signed)
Miscarriage A miscarriage is the loss of an unborn baby (fetus) before the 20th week of pregnancy. Most miscarriages happen during the first 3 months of pregnancy. Sometimes, a miscarriage can happen before a woman knows that she is pregnant. Having a miscarriage can be an emotional experience. If you have had a miscarriage, talk with your health care provider about any questions you may have about miscarrying, the grieving process, and your plans for future pregnancy. What are the causes? A miscarriage may be caused by:  Problems with the genes or chromosomes of the fetus. These problems make it impossible for the baby to develop normally. They are often the result of random errors that occur early in the development of the baby, and are not passed from parent to child (not inherited).  Infection of the cervix or uterus.  Conditions that affect hormone balance in the body.  Problems with the cervix, such as the cervix opening and thinning before pregnancy is at term (cervical insufficiency).  Problems with the uterus. These may include: ? A uterus with an abnormal shape. ? Fibroids in the uterus. ? Congenital abnormalities. These are problems that were present at birth.  Certain medical conditions.  Smoking, drinking alcohol, or using drugs.  Injury (trauma). In many cases, the cause of a miscarriage is not known. What are the signs or symptoms? Symptoms of this condition include:  Vaginal bleeding or spotting, with or without cramps or pain.  Pain or cramping in the abdomen or lower back.  Passing fluid, tissue, or blood clots from the vagina. How is this diagnosed? This condition may be diagnosed based on:  A physical exam.  Ultrasound.  Blood tests.  Urine tests. How is this treated? Treatment for a miscarriage is sometimes not necessary if you naturally pass all the tissue that was in your uterus. If necessary, this condition may be treated with:  Dilation and  curettage (D&C). This is a procedure in which the cervix is stretched open and the lining of the uterus (endometrium) is scraped. This is done only if tissue from the fetus or placenta remains in the body (incomplete miscarriage).  Medicines, such as: ? Antibiotic medicine, to treat infection. ? Medicine to help the body pass any remaining tissue. ? Medicine to reduce (contract) the size of the uterus. These medicines may be given if you have a lot of bleeding. If you have Rh negative blood and your baby was Rh positive, you will need a shot of a medicine called Rh immunoglobulinto protect your future babies from Rh blood problems. "Rh-negative" and "Rh-positive" refer to whether or not the blood has a specific protein found on the surface of red blood cells (Rh factor). Follow these instructions at home: Medicines   Take over-the-counter and prescription medicines only as told by your health care provider.  If you were prescribed antibiotic medicine, take it as told by your health care provider. Do not stop taking the antibiotic even if you start to feel better.  Do not take NSAIDs, such as aspirin and ibuprofen, unless they are approved by your health care provider. These medicines can cause bleeding. Activity  Rest as directed. Ask your health care provider what activities are safe for you.  Have someone help with home and family responsibilities during this time. General instructions  Keep track of the number of sanitary pads you use each day and how soaked (saturated) they are. Write down this information.  Monitor the amount of tissue or blood clots that   you pass from your vagina. Save any large amounts of tissue for your health care provider to examine.  Do not use tampons, douche, or have sex until your health care provider approves.  To help you and your partner with the process of grieving, talk with your health care provider or seek counseling.  When you are ready, meet with  your health care provider to discuss any important steps you should take for your health. Also, discuss steps you should take to have a healthy pregnancy in the future.  Keep all follow-up visits as told by your health care provider. This is important. Where to find more information  The American Congress of Obstetricians and Gynecologists: www.acog.org  [place] Department of Health and Programmer, systems of Women's Health: [place] Contact a health care provider if:  You have a fever or chills.  You have a foul smelling vaginal discharge.  You have more bleeding instead of less. Get help right away if:  You have severe cramps or pain in your back or abdomen.  You pass blood clots or tissue from your vagina that is walnut-sized or larger.  You soak more than 1 regular sanitary pad in an hour.  You become light-headed or weak.  You pass out.  You have feelings of sadness that take over your thoughts, or you have thoughts of hurting yourself. Summary  Most miscarriages happen in the first 3 months of pregnancy. Sometimes miscarriage happens before a woman even knows that she is pregnant.  Follow your health care provider's instruction for home care. Keep all follow-up appointments.  To help you and your partner with the process of grieving, talk with your health care provider or seek counseling. This information is not intended to replace advice given to you by your health care provider. Make sure you discuss any questions you have with your health care provider. Document Revised: 05/29/2018 Document Reviewed: 03/12/2016 Elsevier Patient Education  [place] Pregnancy Loss Pregnancy loss can happen any time during a pregnancy. Often the cause is not known. It is rarely because of anything you did. Pregnancy loss in early pregnancy (during the first trimester) is called a miscarriage. This type of pregnancy loss is the most common.  Pregnancy loss that happens after 20 weeks of pregnancy is called fetal demise if the baby's heart stops beating before birth. Fetal demise is much less common. Some women experience spontaneous labor shortly after fetal demise resulting in a stillborn birth (stillbirth). Any pregnancy loss can be devastating. You will need to recover both physically and emotionally. Most women are able to get pregnant again after a pregnancy loss and deliver a healthy baby. How to manage emotional recovery  Pregnancy loss is very hard emotionally. You may feel many different emotions while you grieve. You may feel sad and angry. You may also feel guilty. It is normal to have periods of crying. Emotional recovery can take longer than physical recovery. It is different for everyone. Taking these steps can help you in managing this loss:  Remember that it is unlikely you did anything to cause the pregnancy loss.  Share your thoughts and feelings with friends, family, and your partner. Remember that your partner is also recovering emotionally.  Make sure you have a good support system. Do not spend too much time alone.  Meet with a pregnancy loss counselor or join a pregnancy loss support group.  Get enough sleep and eat a  healthy diet. Return to regular exercise when you have recovered physically.  Do not use drugs or alcohol to manage your emotions.  Consider seeing a mental health professional to help you recover emotionally.  Ask a friend or loved one to help you decide what to do with any clothing and nursery items you received for your baby. In the case of a stillbirth, many women benefit from taking additional steps in the grieving process. You may want to:  Hold your baby after the birth.  Name your baby.  Request a birth certificate.  Create a keepsake such as handprints or footprints.  Dress your baby and have a picture taken.  Make funeral arrangements.  Ask for a baptism or  blessing. Hospitals have staff members who can help you with all these arrangements. How to recognize emotional stress It is normal to have emotional stress after a pregnancy loss. But emotional stress that lasts a long time or becomes severe requires treatment. Watch out for these signs of severe emotional stress:  Sadness, anger, or guilt that is not going away and is interfering with your normal activities.  Relationship problems that have occurred or gotten worse since the pregnancy loss.  Signs of depression that last longer than 2 weeks. These may include: ? Sadness. ? Anxiety. ? Hopelessness. ? Loss of interest in activities you enjoy. ? Inability to concentrate. ? Trouble sleeping or sleeping too much. ? Loss of appetite or overeating. ? Thoughts of death or of hurting yourself. Follow these instructions at home:  Take over-the-counter and prescription medicines only as told by your health care provider.  Rest at home until your energy level returns. Return to your normal activities as told by your health care provider. Ask your health care provider what activities are safe for you.  When you are ready, meet with your health care provider to discuss steps to take for a future pregnancy.  Keep all follow-up visits as told by your health care provider. This is important. Where to find support  To help you and your partner with the process of grieving, talk with your health care provider or seek counseling.  Consider meeting with others who have experienced pregnancy loss. Ask your health care provider about support groups and resources. Where to find more information  U[place]S. Department of Health and Programmer, systems on Women's Health: [place]  American Pregnancy Association: www.americanpregnancy.org Contact a health care provider if:  You continue to experience grief, sadness, or lack of motivation for everyday activities, and those feelings do not  improve over time.  You are struggling to recover emotionally, especially if you are using alcohol or substances to help. Get help right away if:  You have thoughts of hurting yourself or others. If you ever feel like you may hurt yourself or others, or have thoughts about taking your own life, get help right away. You can go to your nearest emergency department or call:  Your local emergency services (911 in the [place]  A suicide crisis helpline, such as the [place] at (817)514-8234. This is open 24 hours a day. Summary  Any pregnancy loss can be difficult physically and emotionally.  You may experience many different emotions while you grieve. Emotional recovery can last longer than physical recovery.  It is normal to have emotional stress after a pregnancy loss. But emotional stress that lasts a long time or becomes severe requires treatment.  See your health care provider if you are struggling emotionally  after a pregnancy loss. This information is not intended to replace advice given to you by your health care provider. Make sure you discuss any questions you have with your health care provider. Document Revised: 05/27/2018 Document Reviewed: 04/17/2017 Elsevier Patient Education  2020 Elsevier Inc.        Recurrent Pregnancy Loss Recurrent pregnancy loss is the loss of two or more pregnancies before 20 weeks of pregnancy (gestation). What are the causes? The most common cause of recurrent pregnancy loss is an abnormal number of chromosomes in the developing baby (fetus). Chromosomes are the structures inside a cell that hold all the genetic material. Chromosome abnormalities can be inherited, but most of them occur by chance. In most cases of recurrent pregnancy loss, a missing or extra chromosome keeps the baby from developing. It may not be possible to identify which chromosome is defective. Other possible causes of recurrent pregnancy loss  include:  Being born with an abnormal womb structure (septate uterus).  Having noncancerous growths in your uterus (fibroids or polyps).  Having a disease that causes scarring in your uterus (Asherman syndrome).  Having a disease that causes your blood to clot (antiphospholipid syndrome).  Having a disease that increases bleeding (thrombophilia). What increases the risk? The following factors may make you more likely to develop this condition:  Being over the age of 8.  Having diabetes.  Having thyroid disease.  Being Obese.  Being a smoker.  Using recreational drugs.  Using too much alcohol or caffeine. What are the signs or symptoms? Symptoms of this condition include:  Bleeding from the vagina.  Passing clots and fetal tissue from the vagina. How is this diagnosed? This condition is diagnosed with:  A physical exam. This will include a pelvic exam to check the vagina and uterus for possible causes of pregnancy loss.  An ultrasound. This is done: ? To confirm the pregnancy loss. ? To see if the structure of the uterus is normal. ? To check for polyps or fibroids. Sometimes other tests are done, such as:  Blood tests to see if you have a condition that causes recurrent pregnancy loss.  Blood tests to see if your blood clots normally.  Genetic tests of you and your partner. How is this treated? Treatment for this condition depends on the cause of the pregnancy loss. Possible treatments include:  Having your eggs fertilized outside your uterus (in vitro fertilization). By doing this, a health care provider may be able to select eggs without chromosome abnormalities.  Taking a blood thinner to prevent clotting. This may be done if you have antiphospholipid syndrome.  Having surgery to correct the abnormality in your uterus.  Taking medicines to treat underlying causes of pregnancy loss. Follow these instructions at home: Lifestyle  Do not use any products  that contain nicotine or tobacco, such as cigarettes and e-cigarettes. If you need help quitting, ask your health care provider.  Do not use drugs.  Eat a balanced diet rich in fresh fruit and vegetables, whole grains, low-fat dairy, and lean meats.  Manage any chronic health conditions, such as diabetes or thyroid disease.  Get 30 minutes of moderate daily exercise.  If you are overweight, ask your health care provider for support and resources to lose weight.  Find ways to manage stress. These include journaling, yoga, or deep breathing. They also include spending time in nature and practicing meditation. General instructions  Take over-the-counter and prescription medicines only as told by your health care provider.  Get support from friends and loved ones. Unexpected pregnancy loss can be a sad and stressful event.  Consider meeting with a counselor, spiritual leader, or a pregnancy loss support group.  Ask your health care provider about taking a folic acid supplement.  Meet with a Dentist as part of genetic testing to understand the results of any testing.  Keep all follow-up visits as told by your health care provider. This is important. Contact a health care provider if:  You have been trying to get pregnant without success.  You are struggling with sadness or depression. Get help right away if:  You have feelings of sadness that take over your thoughts.  You have thoughts of hurting yourself. Summary  The most common cause of recurrent pregnancy loss is an abnormal number of chromosomes in the developing baby (fetus).  Most of the time, recurrent pregnancy loss happens by chance.  Talk to your health care provider if you are trying to get pregnant and have a history of recurrent pregnancy loss. This information is not intended to replace advice given to you by your health care provider. Make sure you discuss any questions you have with your health care  provider. Document Revised: 05/29/2018 Document Reviewed: 05/01/2016 Elsevier Patient Education  2020 [employer].

## 2019-10-18 NOTE — MAU Provider Note (Signed)
History     CSN: [id number]  Arrival date and time: 10/18/19 0144   First Provider Initiated Contact with Patient 10/18/19 [phone]      Chief Complaint  Patient presents with  . Abdominal Pain  . Vaginal Bleeding   [name] is a 29 y.o. [phone] at [redacted]w[redacted]d who presents to MAU for vaginal bleeding which began 3 days ago. Patient reports bleeding started out "really light" and then today it got progressively worse with clotting and "stringy dark blood." Patient reports clots were about the size of a dime. Patient reports she did pass a circular clot about half the size of her palm while in MAU, but flushed it prior to being viewed by staff. Patient also endorses period-like cramping, which is only slightly present at this time. Patient reports most of her discomfort tonight was at the bottom of her uterus and "at her cervix" but reports that was earlier tonight and is not really present at this time. Patient reports today was her heaviest day of bleeding and she used 2 pads today  Passing blood clots? Per above Blood soaking clothes? no Lightheaded/dizzy? no Significant pelvic pain or cramping? Per above Passed any tissue? no  Current pregnancy problems? Pt has not yet been seen Blood Type? A NEGATIVE Allergies? NKDA Current medications? PNVs, iron Current P[facility] & next appt? [facility]  Pt denies vaginal discharge/odor/itching. Pt denies N/V, abdominal pain, constipation, diarrhea, or urinary problems. Pt denies fever, chills, fatigue, sweating or changes in appetite. Pt denies SOB or chest pain. Pt denies dizziness, HA, light-headedness, weakness.   OB History    Gravida  4   Para  2   Term  2   Preterm      AB  1   Living  2     SAB  1   TAB      Ectopic      Multiple      Live Births  2           History reviewed. No pertinent past medical history.  Past Surgical History:  Procedure Laterality Date  . ADENOIDECTOMY      Family  History  Problem Relation Age of Onset  . Hypertension Mother     Social History   Tobacco Use  . Smoking status: Never Smoker  . Smokeless tobacco: Never Used  Substance Use Topics  . Alcohol use: Not Currently  . Drug use: Never    Allergies: No Known Allergies  Medications Prior to Admission  Medication Sig Dispense Refill Last Dose  . ferrous sulfate 325 (65 FE) MG tablet Take 1 tablet (325 mg total) by mouth 2 (two) times daily with a meal. 60 tablet 3 10/17/2019 at Unknown time  . Prenatal Vit-Fe Fumarate-FA (PRENATAL MULTIVITAMIN) TABS tablet Take 1 tablet by mouth daily at 12 noon.   10/17/2019 at Unknown time  . acetaminophen (TYLENOL) 500 MG tablet Take 500 mg by mouth every 6 (six) hours as needed for mild pain or headache.       Review of Systems  Constitutional: Negative for chills, diaphoresis, fatigue and fever.  Eyes: Negative for visual disturbance.  Respiratory: Negative for shortness of breath.   Cardiovascular: Negative for chest pain.  Gastrointestinal: Negative for abdominal pain, constipation, diarrhea, nausea and vomiting.  Genitourinary: Positive for pelvic pain and vaginal bleeding. Negative for dysuria, flank pain, frequency, urgency and vaginal discharge.  Neurological: Negative for dizziness, weakness, light-headedness and headaches.   Physical Exam  Blood pressure 111/71, pulse 73, temperature 97.9 F (36.6 C), temperature source Oral, resp. rate 18, weight 59.8 kg, last menstrual period 08/12/2019, SpO2 100 %, unknown if currently breastfeeding.  Patient Vitals for the past 24 hrs:  BP Temp Temp src Pulse Resp SpO2 Weight  10/18/19 0201 111/71 97.9 F (36.6 C) Oral 73 18 100 % 59.8 kg   Physical Exam Vitals and nursing note reviewed. Exam conducted with a chaperone present.  Constitutional:      General: She is not in acute distress.    Appearance: Normal appearance. She is normal weight. She is not ill-appearing, toxic-appearing or  diaphoretic.  HENT:     Head: Normocephalic and atraumatic.  Pulmonary:     Effort: Pulmonary effort is normal.  Abdominal:     Palpations: Abdomen is soft.  Genitourinary:    General: Normal vulva.     Labia:        Right: No rash, tenderness, lesion or injury.        Left: No rash, tenderness, lesion or injury.      Cervix: Normal and dilated.     Comments: On start of exam, vaginal vault full of bright red blood. Long clot about 5cm x 1cm removed along with liquid blood. Behind blood was likely gestational sac, removed gently with ring forceps in two pieces. No active bleeding after removal of likely gestational sac. New pad placed for pad check in 1.5 hours. Skin:    General: Skin is warm and dry.  Neurological:     Mental Status: She is alert and oriented to person, place, and time.  Psychiatric:        Mood and Affect: Mood normal.        Behavior: Behavior normal.        Thought Content: Thought content normal.        Judgment: Judgment normal.    Results for orders placed or performed during the hospital encounter of 10/18/19 (from the past 24 hour(s))  Pregnancy, urine POC     Status: Abnormal   Collection Time: 10/18/19  1:59 AM  Result Value Ref Range   Preg Test, Ur POSITIVE (A) NEGATIVE  Urinalysis, Routine w reflex microscopic     Status: Abnormal   Collection Time: 10/18/19  1:59 AM  Result Value Ref Range   Color, Urine YELLOW YELLOW   APPearance HAZY (A) CLEAR   Specific Gravity, Urine 1.018 1.005 - 1.030   pH 7.0 5.0 - 8.0   Glucose, UA NEGATIVE NEGATIVE mg/dL   Hgb urine dipstick LARGE (A) NEGATIVE   Bilirubin Urine NEGATIVE NEGATIVE   Ketones, ur NEGATIVE NEGATIVE mg/dL   Protein, ur NEGATIVE NEGATIVE mg/dL   Nitrite NEGATIVE NEGATIVE   Leukocytes,Ua NEGATIVE NEGATIVE   RBC / HPF >50 (H) 0 - 5 RBC/hpf   WBC, UA 0-5 0 - 5 WBC/hpf   Bacteria, UA NONE SEEN NONE SEEN   Squamous Epithelial / LPF 0-5 0 - 5   Mucus PRESENT   CBC     Status: Abnormal    Collection Time: 10/18/19  2:26 AM  Result Value Ref Range   WBC 8.1 4.0 - 10.5 K/uL   RBC 4.16 3.87 - 5.11 MIL/uL   Hemoglobin 13.2 12.0 - 15.0 g/dL   HCT 91.4 36 - 46 %   MCV 94.5 80.0 - 100.0 fL   MCH 31.7 26.0 - 34.0 pg   MCHC 33.6 30.0 - 36.0 g/dL   RDW 78.2 95.6 - 21.3 %  Platelets 149 (L) 150 - 400 K/uL   nRBC 0.0 0.0 - 0.2 %  Comprehensive metabolic panel     Status: Abnormal   Collection Time: 10/18/19  2:26 AM  Result Value Ref Range   Sodium 136 135 - 145 mmol/L   Potassium 3.8 3.5 - 5.1 mmol/L   Chloride 102 98 - 111 mmol/L   CO2 23 22 - 32 mmol/L   Glucose, Bld 103 (H) 70 - 99 mg/dL   BUN 9 6 - 20 mg/dL   Creatinine, Ser 3.35 0.44 - 1.00 mg/dL   Calcium 9.4 8.9 - 45.6 mg/dL   Total Protein 7.0 6.5 - 8.1 g/dL   Albumin 4.2 3.5 - 5.0 g/dL   AST 19 15 - 41 U/L   ALT 12 0 - 44 U/L   Alkaline Phosphatase 37 (L) 38 - 126 U/L   Total Bilirubin 0.7 0.3 - 1.2 mg/dL   GFR calc non Af Amer >60 >60 mL/min   GFR calc Af Amer >60 >60 mL/min   Anion gap 11 5 - 15  hCG, quantitative, pregnancy     Status: Abnormal   Collection Time: 10/18/19  2:26 AM  Result Value Ref Range   hCG, Beta Chain, Quant, S 35,862 (H) <5 mIU/mL  Rh IG workup (includes ABO/Rh)     Status: None (Preliminary result)   Collection Time: 10/18/19  2:26 AM  Result Value Ref Range   Gestational Age(Wks) 9    ABO/RH(D) A NEG    Antibody Screen NEG    Unit Number [id number]    Blood Component Type RHIG    Unit division 00    Status of Unit ISSUED    Transfusion Status      OK TO TRANSFUSE Performed at [facility], [address] [place], [place] [address]   Wet prep, genital     Status: None   Collection Time: 10/18/19  2:27 AM   Specimen: Vaginal  Result Value Ref Range   Yeast Wet Prep HPF POC NONE SEEN NONE SEEN   Trich, Wet Prep NONE SEEN NONE SEEN   Clue Cells Wet Prep HPF POC NONE SEEN NONE SEEN   WBC, Wet Prep HPF POC NONE SEEN NONE SEEN   Sperm NONE SEEN    US OB Comp Less  14 Wks  Result Date: 10/18/2019 CLINICAL DATA:  Initial evaluation for heavy vaginal bleeding, early pregnancy. EXAM: OBSTETRIC <14 WK ULTRASOUND TECHNIQUE: Transabdominal ultrasound was performed for evaluation of the gestation as well as the maternal uterus and adnexal regions. COMPARISON:  None available. FINDINGS: Intrauterine gestational sac: Single gestational sac is seen, abnormally position within the vaginal vault. Endometrial complex thickened with a small fluid collection seen near the uterine fundus. Associated vascularity consistent with retained products. Yolk sac:  Present. Embryo:  Not visualized. Cardiac Activity: Negative. MSD:  16.2 mm   6 w   3 d Subchorionic hemorrhage:  None visualized. Maternal uterus/adnexae: Ovaries are within normal limits bilaterally. No adnexal mass or free fluid. IMPRESSION: 1. Gestational sac with internal yolk sac abnormally positioned within the vaginal vault, worrisome for a SAB in progress. 2. Small fluid collection with increased vascularity within the endometrial complex, consistent with retained products of conception. Correlation with serial beta hCGs and close interval follow-up ultrasound recommended as warranted. 3. No other acute maternal uterine or adnexal abnormality. Electronically Signed   By: Rise Mu M.D.   On: 10/18/2019 04:57    MAU Course  Procedures  MDM -r/o ectopic -UA: hazy/lg hgb -CBC: platelets 149 -CMP: GLU 103, alkaline phosphatase 37 -[place]: gestational sac with internal yolk sac abnormally positioned within the vaginal vault, concerning for SAB in progress; small fluid collection with increased vascularity within endometrial complex, consistent with retained POCs -hCG: 63,875 -ABO: A NEGATIVE, RhoGAM given -WetPrep: WNL -GC/CT collected -Tylenol 1000mg  given to patient for mild cramping -consulted with Dr. re: cytotec in setting of platelets 149, OK to give RX for home use per discharge instructions, and  Dr. Despina Hidden does not believe there are retained products but rather decidualized endometrium that does not represent retained POCs -new pad on at 5AM, 1.5 hours later minimal bleeding present on pad -pt and husband present for entire visit and desire to take home likely POCs after discussion about value of sending to lab for evaluation. House coverage to bedside with necessary paperwork. -pt discharged to home in stable condition  Orders Placed This Encounter  Procedures  . Wet prep, genital    Standing Status:   Standing    Number of Occurrences:   1  . Despina Hidden OB Comp Less 14 Wks    Standing Status:   Standing    Number of Occurrences:   1    Order Specific Question:   Symptom/Reason for Exam    Answer:   Vaginal bleeding in pregnancy [[id number]]  . CBC    Standing Status:   Standing    Number of Occurrences:   1  . Comprehensive metabolic panel    Standing Status:   Standing    Number of Occurrences:   1  . hCG, quantitative, pregnancy    Standing Status:   Standing    Number of Occurrences:   1  . Urinalysis, Routine w reflex microscopic    Standing Status:   Standing    Number of Occurrences:   1  . Pregnancy, urine POC    Standing Status:   Standing    Number of Occurrences:   1  . Rh IG workup (includes ABO/Rh)    Standing Status:   Standing    Number of Occurrences:   1    Order Specific Question:   Weeks of Gestation    Answer:   9  . Discharge patient    Order Specific Question:   Discharge disposition    Answer:   01-Home or Self Care [1]    Order Specific Question:   Discharge patient date    Answer:   10/18/2019   Meds ordered this encounter  Medications  . rho (d) immune globulin (RHIG/RHOPHYLAC) injection 300 mcg  . acetaminophen (TYLENOL) tablet 1,000 mg  . misoprostol (CYTOTEC) 200 MCG tablet    Sig: Take 1 tablet (200 mcg total) by mouth in the morning, at noon, and at bedtime for 3 days.    Dispense:  9 tablet    Refill:  0    Order Specific Question:    Supervising Provider    Answer:   10/20/2019 H [[id number]]    Assessment and Plan   1. Miscarriage   2. Vaginal bleeding in pregnancy   3. Rh negative state in antepartum period     Allergies as of 10/18/2019   No Known Allergies     Medication List    TAKE these medications   acetaminophen 500 MG tablet Commonly known as: TYLENOL Take 500 mg by mouth every 6 (six) hours as needed for mild pain or headache.   ferrous sulfate 325 (  65 FE) MG tablet Take 1 tablet (325 mg total) by mouth 2 (two) times daily with a meal.   misoprostol 200 MCG tablet Commonly known as: Cytotec Take 1 tablet (200 mcg total) by mouth in the morning, at noon, and at bedtime for 3 days.   prenatal multivitamin Tabs tablet Take 1 tablet by mouth daily at 12 noon.      -will call with culture results, if positive -RX 200mcg Cytotec TID x3 day, buccal per Dr. Despina HiddenEure -discussed SAB return precautions -message sent to [facility] to schedule f/u in one week for hCG and in two weeks for hCG/provider visit -return MAU precautions given -pt discharged to home in stable condition  Joni Reiningicole E Godric Lavell 10/18/2019, 6:54 AM

## 2019-10-19 LAB — RH IG WORKUP (INCLUDES ABO/RH)
ABO/RH(D): A NEG
Antibody Screen: NEGATIVE
Gestational Age(Wks): 9
Unit division: 0

## 2019-10-19 LAB — GC/CHLAMYDIA PROBE AMP (~~LOC~~) NOT AT ARMC
Chlamydia: NEGATIVE
Comment: NEGATIVE
Comment: NORMAL
Neisseria Gonorrhea: NEGATIVE

## 2019-10-26 ENCOUNTER — Other Ambulatory Visit: Payer: PRIVATE HEALTH INSURANCE

## 2019-10-26 ENCOUNTER — Other Ambulatory Visit: Payer: Self-pay | Admitting: Obstetrics and Gynecology

## 2019-10-26 ENCOUNTER — Other Ambulatory Visit: Payer: Self-pay

## 2019-10-26 DIAGNOSIS — O039 Complete or unspecified spontaneous abortion without complication: Secondary | ICD-10-CM

## 2019-10-26 DIAGNOSIS — O4690 Antepartum hemorrhage, unspecified, unspecified trimester: Secondary | ICD-10-CM

## 2019-10-27 LAB — BETA HCG QUANT (REF LAB): hCG Quant: 531 m[IU]/mL

## 2019-11-03 ENCOUNTER — Ambulatory Visit (INDEPENDENT_AMBULATORY_CARE_PROVIDER_SITE_OTHER): Payer: PRIVATE HEALTH INSURANCE | Admitting: Advanced Practice Midwife

## 2019-11-03 ENCOUNTER — Encounter: Payer: Self-pay | Admitting: Advanced Practice Midwife

## 2019-11-03 ENCOUNTER — Other Ambulatory Visit: Payer: Self-pay

## 2019-11-03 VITALS — BP 98/63 | HR 68 | Ht 66.0 in | Wt 131.0 lb

## 2019-11-03 DIAGNOSIS — O039 Complete or unspecified spontaneous abortion without complication: Secondary | ICD-10-CM | POA: Diagnosis not present

## 2019-11-03 DIAGNOSIS — Z8759 Personal history of other complications of pregnancy, childbirth and the puerperium: Secondary | ICD-10-CM

## 2019-11-03 NOTE — Progress Notes (Signed)
Pt. Presents for SAB follow up. Pt. Denies pain at this time and states she had some cramping the night before.

## 2019-11-03 NOTE — Patient Instructions (Signed)

## 2019-11-03 NOTE — Progress Notes (Signed)
  GYNECOLOGY PROGRESS NOTE  History:  29 y.o. [id number] presents to [facility] office today for follow up after spontaneous miscarriage. She was seen in MAU 10/18/19 and had US showing gestational sac and yolk sac in lower uterine segment and passed POCs during exam.  Hcg at that time was 35, 862.  RhoGam was given in 10/18/19 for Rh negative status and vaginal bleeding in pregnancy.  Cytotec was prescribed to take outpatient.  Since she had large amount of bleeding before and during MAU stay, pt did not take Cytotec.  Bleeding resolved 3-4 days ago.  There is no pain.  She denies h/a, dizziness, shortness of breath, n/v, or fever/chills.    The following portions of the patient's history were reviewed and updated as appropriate: allergies, current medications, past family history, past medical history, past social history, past surgical history and problem list. Last pap smear in 2020 was normal per pt.  Review of Systems:  Pertinent items are noted in HPI.   Objective:  Physical Exam Blood pressure 98/63, pulse 68, height 5\' 6"  (1.676 m), weight 131 lb (59.4 kg), last menstrual period 08/12/2019, unknown if currently breastfeeding. VS reviewed, nursing note reviewed,  Constitutional: well developed, well nourished, no distress HEENT: normocephalic CV: normal rate Pulm/chest wall: normal effort Breast Exam: deferred Abdomen: soft Neuro: alert and oriented x 3 Skin: warm, dry Psych: affect normal Pelvic exam: Deferred  Assessment & Plan:  1. 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  Questions answered. Offered evaluation with labwork for miscarriage, pt declined at this time.  --Pt asked about taking progesterone now before another pregnancy or during pregnancy to prevent miscarriage.  Reviewed the lack of support for this treatment.  I did recommend an early prenatal visit with her next pregnancy and we can consider checking progesterone level  and treating if it is low, but this is also not well supported in the research.   --Will follow hcg down today, and pt may have 1 more lab only visit next week to check hcg. --Reassurance provided that miscarriage is rarely because of something she did or didn't do and likely chromosomal and out of her control.  Pt states understanding. - Beta hCG quant (ref lab)   08/14/2019, CNM 2:13 PM

## 2019-11-04 LAB — BETA HCG QUANT (REF LAB): hCG Quant: 56 m[IU]/mL

## 2019-11-10 ENCOUNTER — Other Ambulatory Visit: Payer: PRIVATE HEALTH INSURANCE

## 2019-11-10 ENCOUNTER — Other Ambulatory Visit: Payer: Self-pay

## 2019-11-10 DIAGNOSIS — O039 Complete or unspecified spontaneous abortion without complication: Secondary | ICD-10-CM

## 2019-11-11 LAB — BETA HCG QUANT (REF LAB): hCG Quant: 18 m[IU]/mL

## 2019-11-24 ENCOUNTER — Other Ambulatory Visit: Payer: PRIVATE HEALTH INSURANCE

## 2019-11-25 ENCOUNTER — Other Ambulatory Visit: Payer: PRIVATE HEALTH INSURANCE

## 2019-11-25 ENCOUNTER — Other Ambulatory Visit: Payer: Self-pay

## 2019-11-25 DIAGNOSIS — O039 Complete or unspecified spontaneous abortion without complication: Secondary | ICD-10-CM

## 2019-11-26 LAB — BETA HCG QUANT (REF LAB): hCG Quant: 3 m[IU]/mL

## 2021-04-09 IMAGING — US US OB COMP LESS 14 WK
1 series · 15 of 28 positions shown · non-contrast
Comparison: None.

CLINICAL DATA: Heavy vaginal bleeding

EXAM:
OBSTETRIC <14 WK ULTRASOUND
TECHNIQUE: Transabdominal ultrasound was performed for evaluation of the
gestation as well as the maternal uterus and adnexal regions.

[Series 1: us ob comp less 14 wk · 15 of 42 slices shown]
[im 1/42]
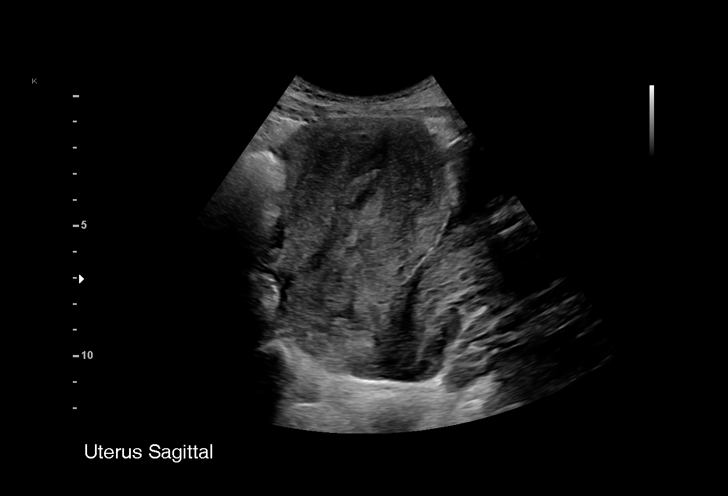
[im 4/42]
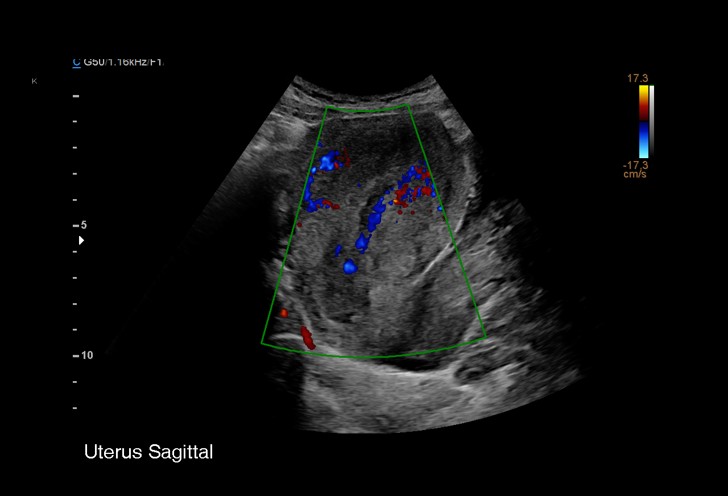
[im 7/42]
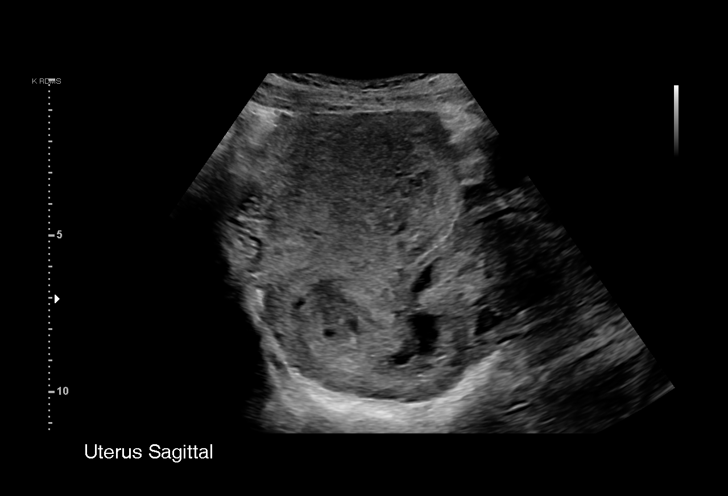
[im 10/42]
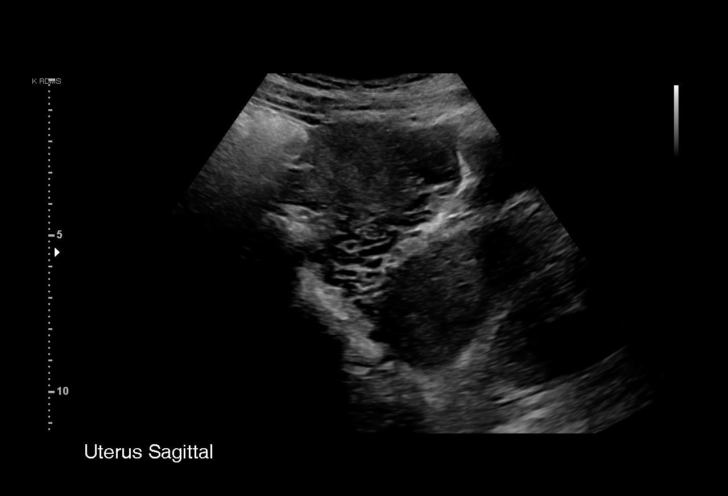
[im 13/42]
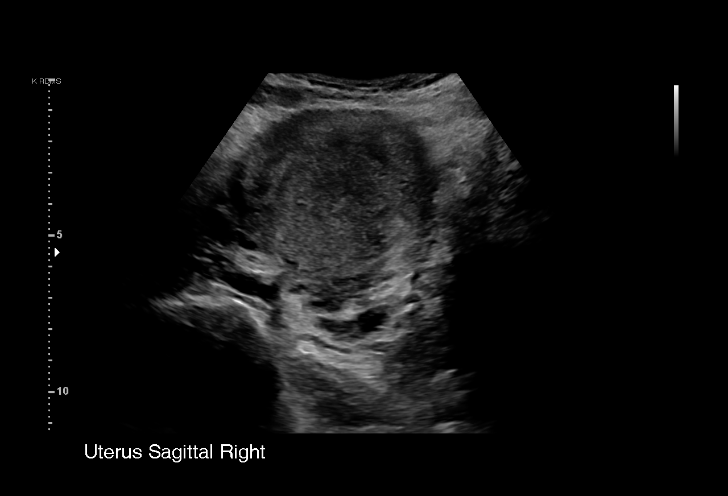
[im 16/42]
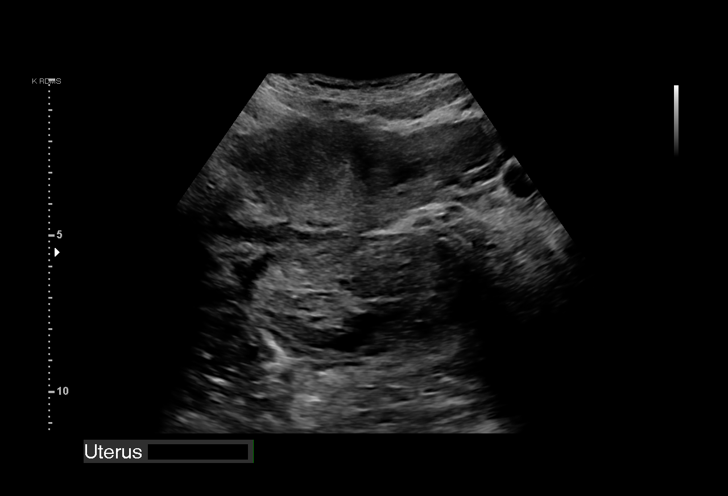
[im 19/42]
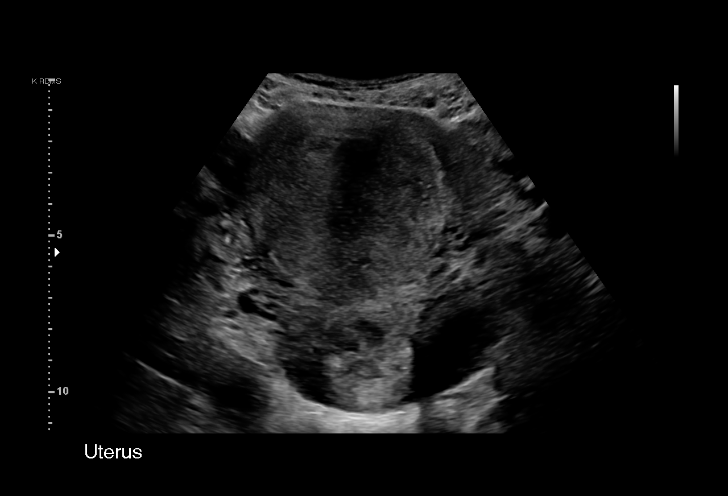
[im 22/42]
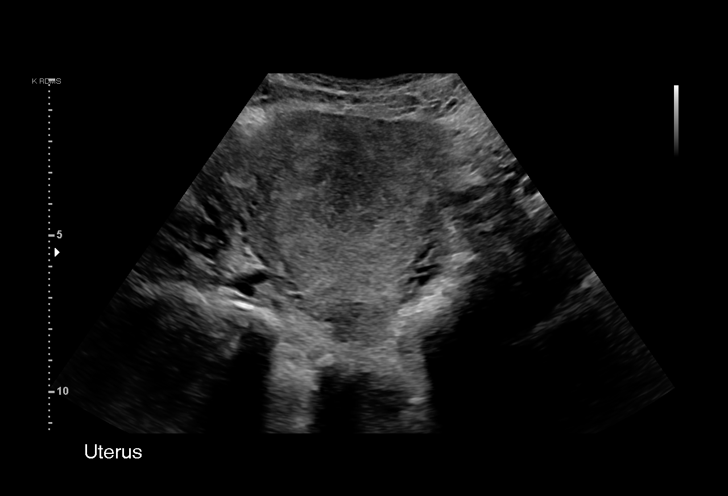
[im 23/42]
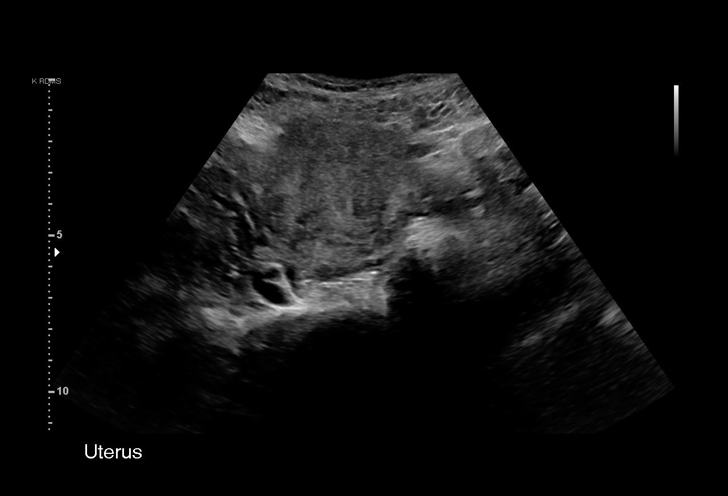
[im 26/42]
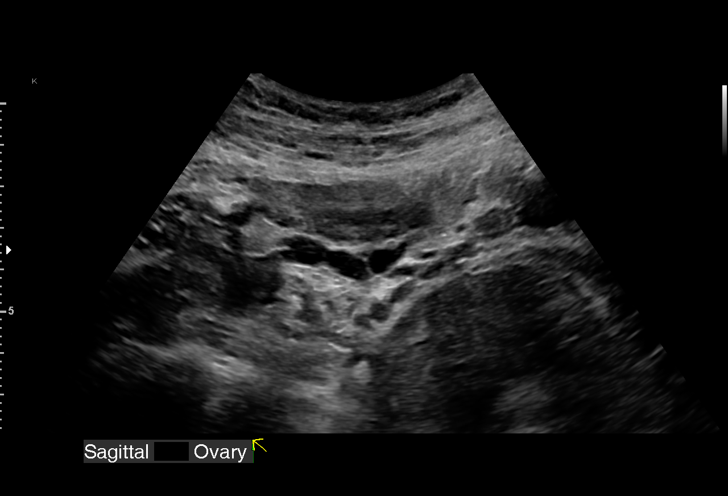
[im 29/42]
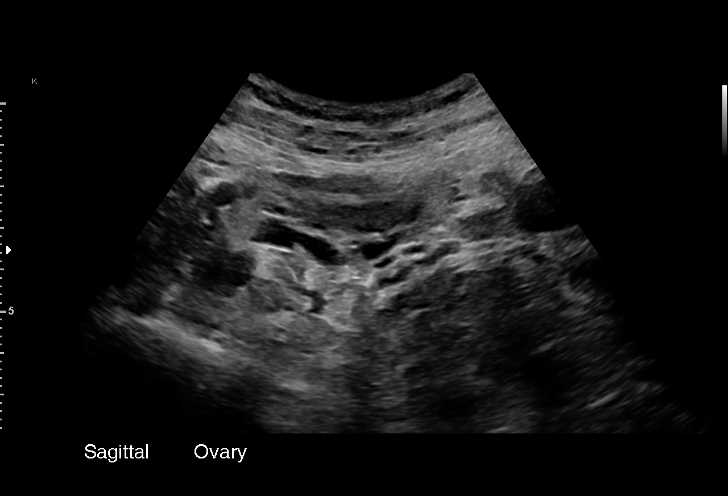
[im 32/42]
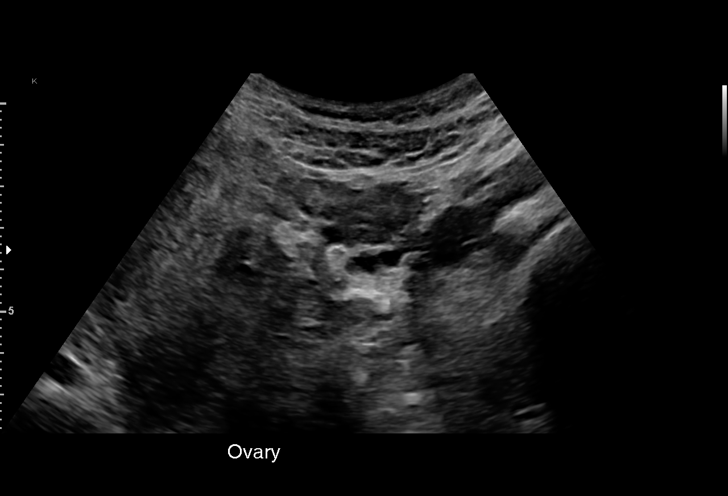
[im 35/42]
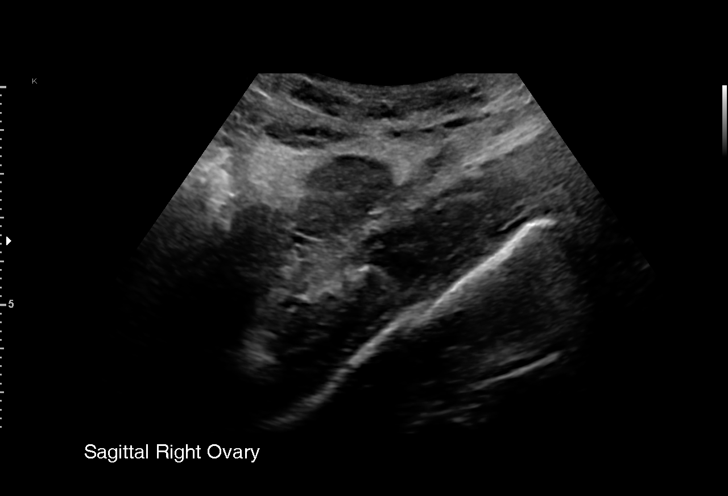
[im 38/42]
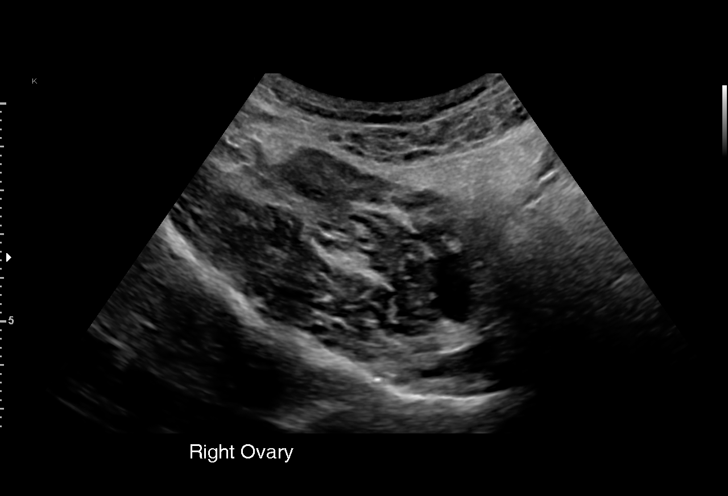
[im 42/42]
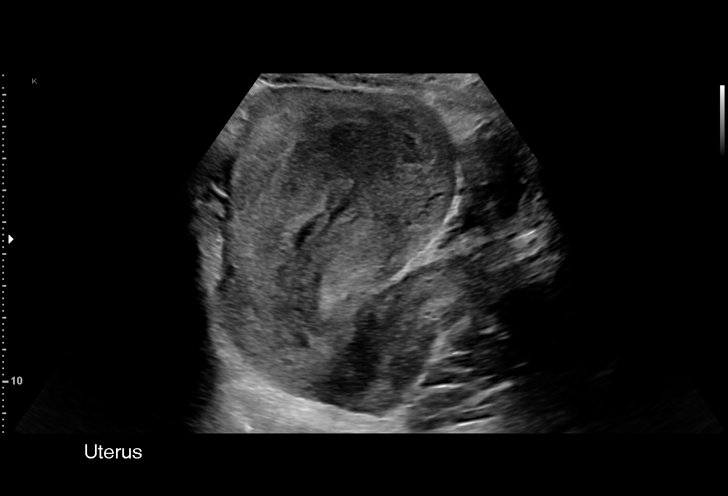

[15 of 28 positions shown; findings below may reference images not displayed]

FINDINGS: Intrauterine gestational sac: None

Maternal uterus/adnexae: Endometrium is thickened and slightly
heterogeneous measuring 16 mm. The uterus is anteverted. No uterine
masses. Heterogeneous material within the cervical canal consistent
with blood products.

Left ovary measures 3.0 x 1.5 x 2.1 cm in the right ovary measures
2.8 x 1.8 x 1.7 cm. No pelvic free fluid.
IMPRESSION: 1. No evidence of intrauterine pregnancy at this time. Heterogeneous
endometrial thickening and likely blood products within the cervix
may suggest spontaneous abortion. Serial beta HCG measurements and
follow-up ultrasound may be needed.
2. Otherwise unremarkable exam.

## 2021-09-17 IMAGING — US US OB COMP LESS 14 WK
1 series · 15 of 28 positions shown · non-contrast
Comparison: None available.

CLINICAL DATA: Initial evaluation for heavy vaginal bleeding, early
pregnancy.

EXAM:
OBSTETRIC <14 WK ULTRASOUND
TECHNIQUE: Transabdominal ultrasound was performed for evaluation of the
gestation as well as the maternal uterus and adnexal regions.

[Series 1: us ob comp less 14 wk · 15 of 42 slices shown]
[im 1/42]
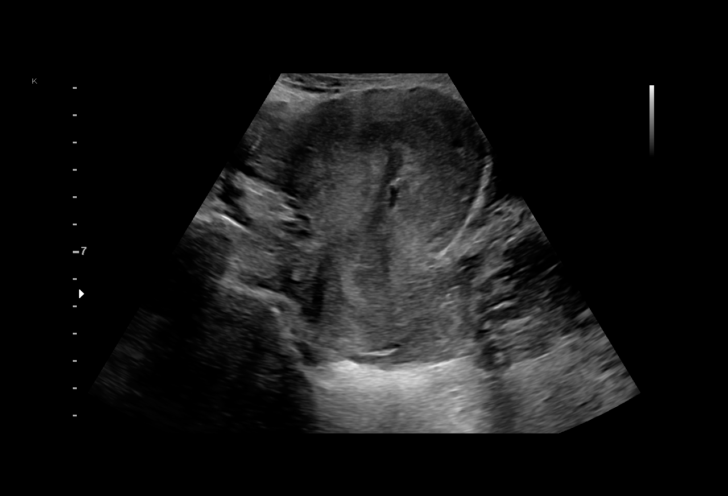
[im 4/42]
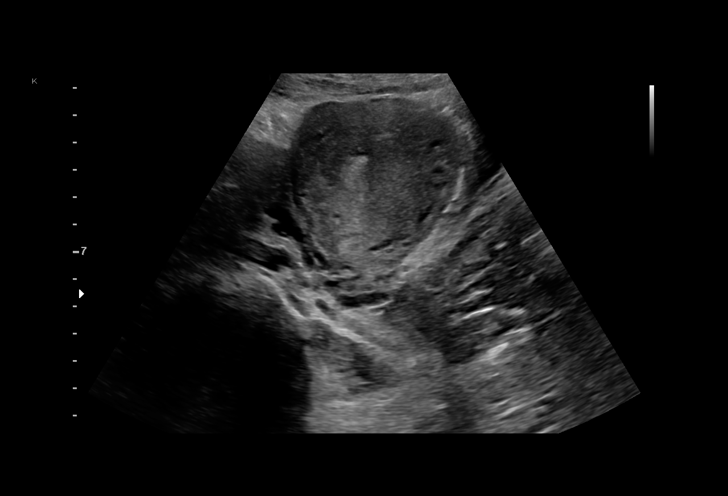
[im 7/42]
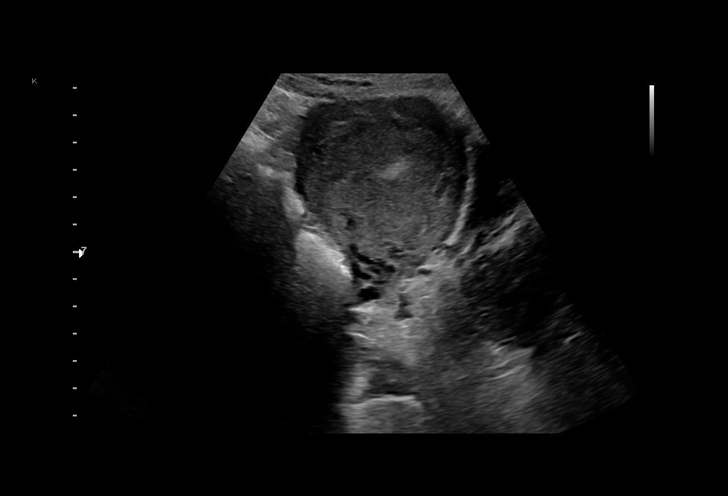
[im 10/42]
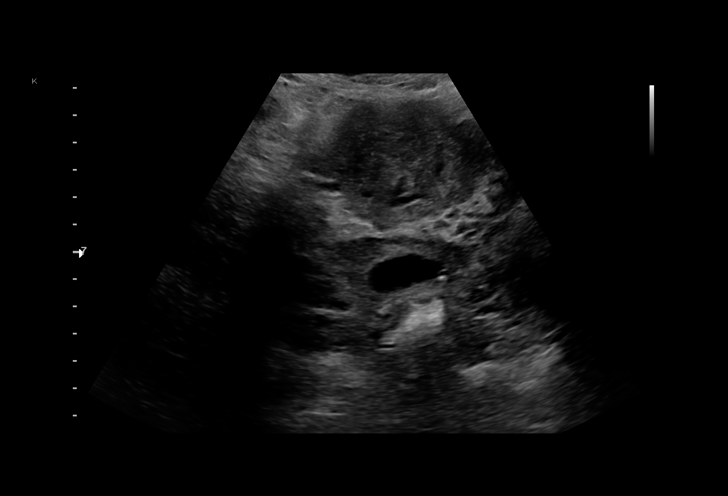
[im 13/42]
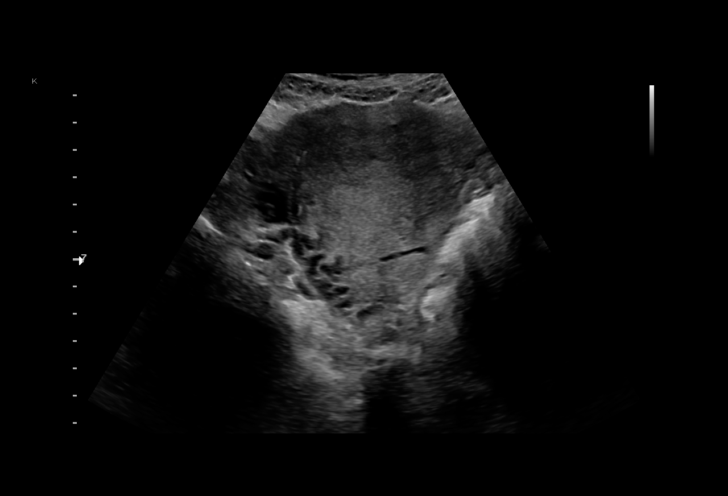
[im 16/42]
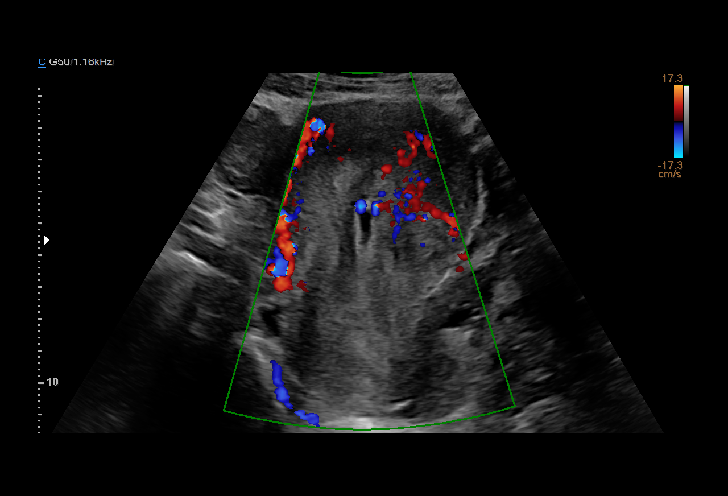
[im 19/42]
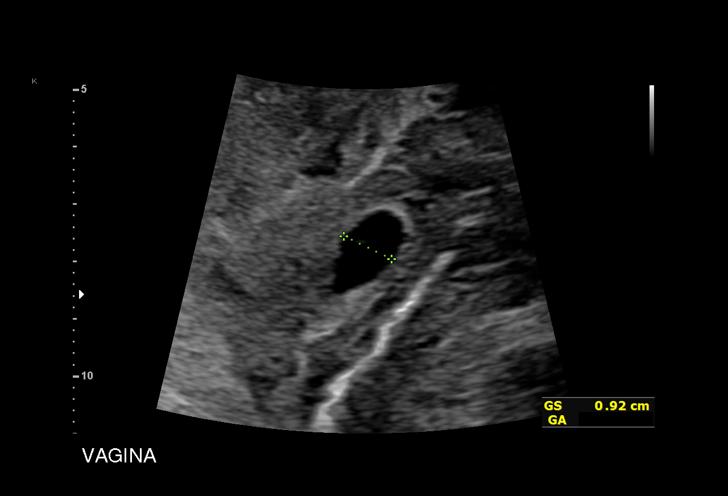
[im 22/42]
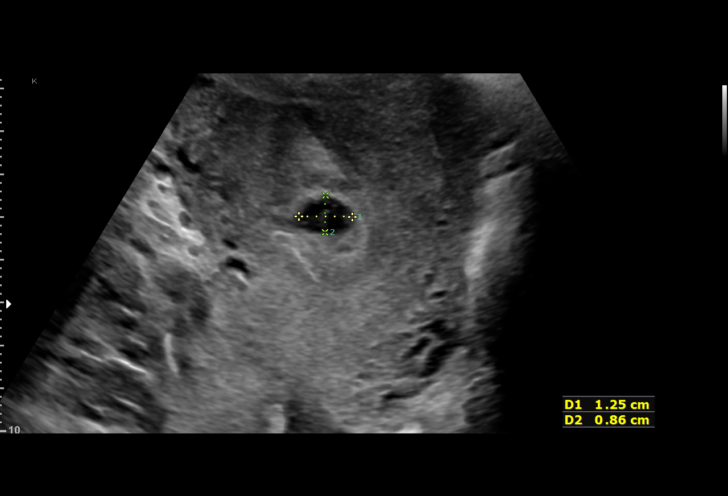
[im 23/42]
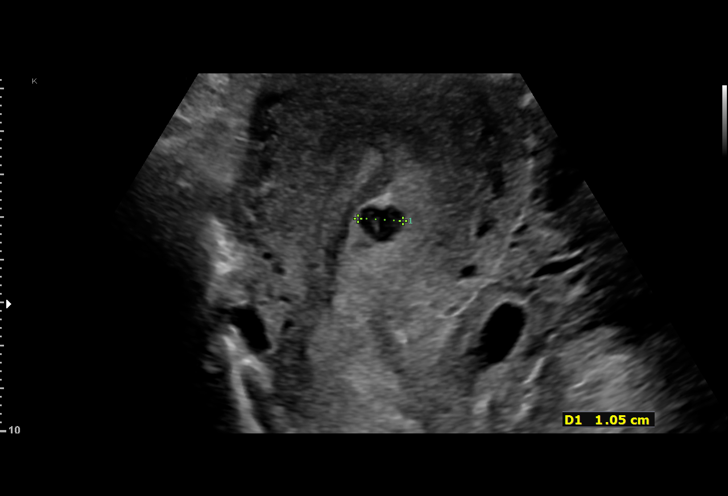
[im 26/42]
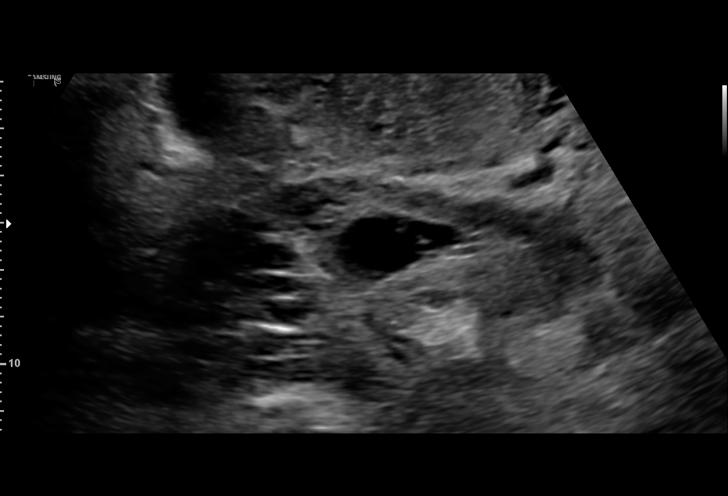
[im 29/42]
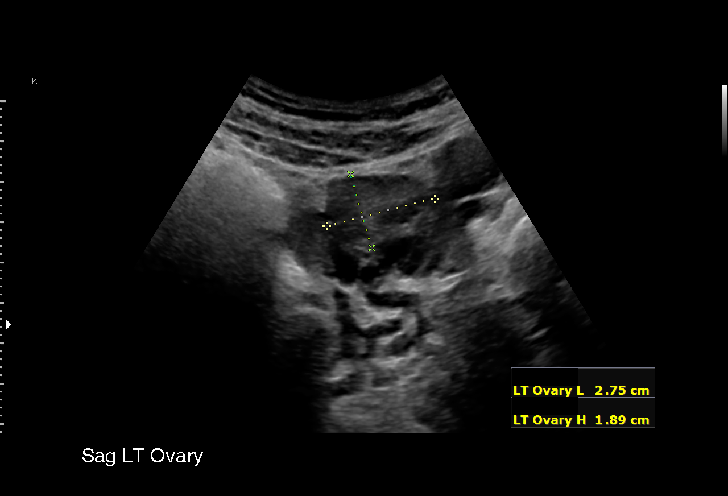
[im 32/42]
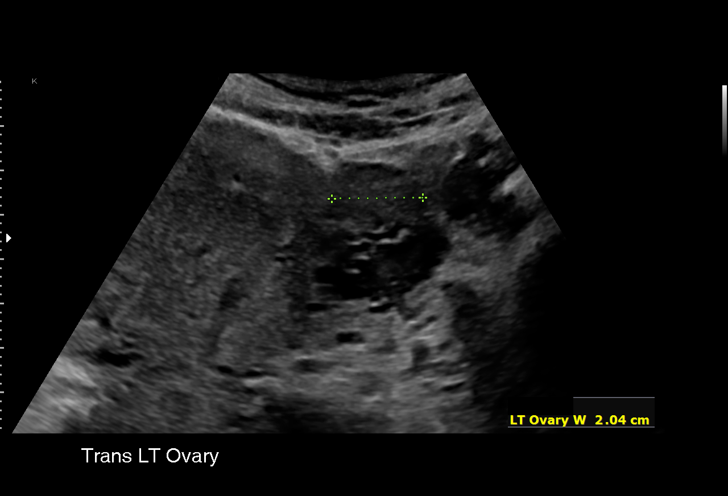
[im 35/42]
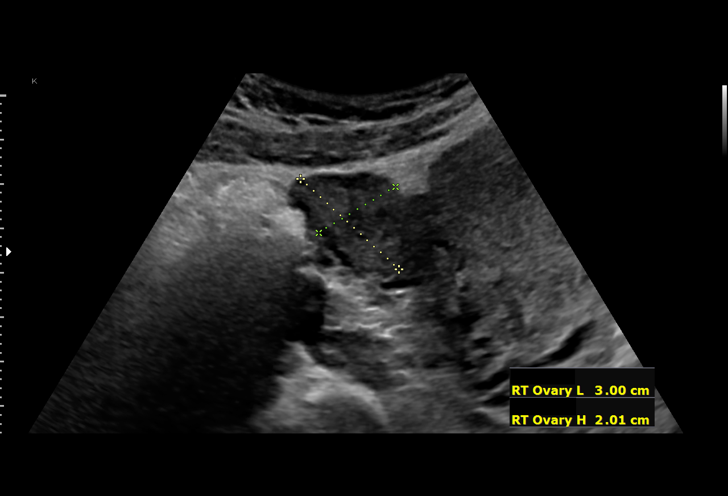
[im 38/42]
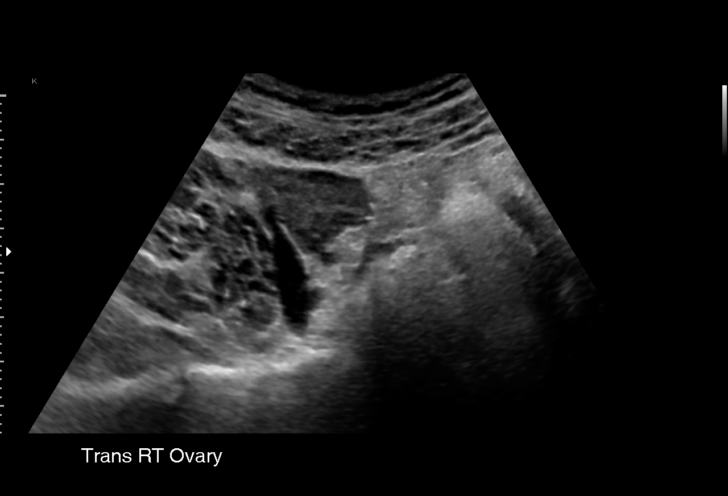
[im 42/42]
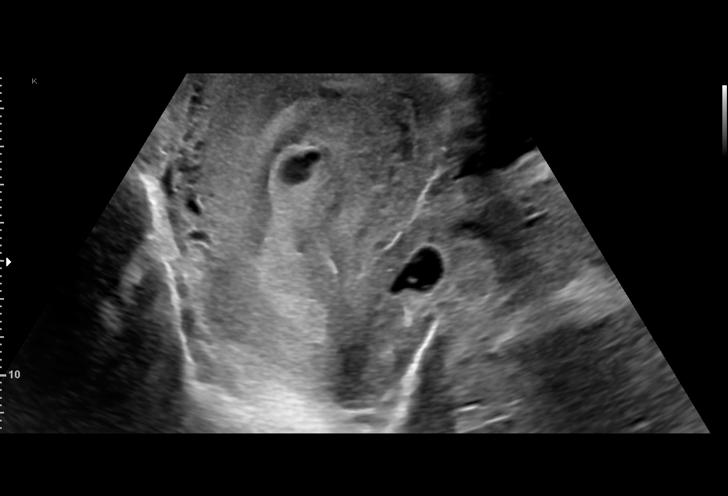

[15 of 28 positions shown; findings below may reference images not displayed]

FINDINGS: Intrauterine gestational sac: Single gestational sac is seen,
abnormally position within the vaginal vault. Endometrial complex
thickened with a small fluid collection seen near the uterine
fundus. Associated vascularity consistent with retained products.

Yolk sac:  Present.

Embryo:  Not visualized.

Cardiac Activity: Negative.

MSD:  16.2 mm   6 w   3 d

Subchorionic hemorrhage:  None visualized.

Maternal uterus/adnexae: Ovaries are within normal limits
bilaterally. No adnexal mass or free fluid.
IMPRESSION: 1. Gestational sac with internal yolk sac abnormally positioned
within the vaginal vault, worrisome for a SAB in progress.
2. Small fluid collection with increased vascularity within the
endometrial complex, consistent with retained products of
conception. Correlation with serial beta hCGs and close interval
follow-up ultrasound recommended as warranted.
3. No other acute maternal uterine or adnexal abnormality.
